# Patient Record
Sex: Female | Born: 1990 | Race: White | Hispanic: No | Marital: Single | Smoking: Former smoker
Health system: Southern US, Community
[De-identification: ages and names within clinical notes are randomized; demographics above are authoritative.]

## PROBLEM LIST (undated history)

## (undated) DIAGNOSIS — E282 Polycystic ovarian syndrome: Secondary | ICD-10-CM

## (undated) DIAGNOSIS — Z87442 Personal history of urinary calculi: Secondary | ICD-10-CM

## (undated) HISTORY — PX: OTHER SURGICAL HISTORY: SHX169

---
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
## 2008-04-06 ENCOUNTER — Emergency Department (HOSPITAL_COMMUNITY): Admission: EM | Admit: 2008-04-06 | Discharge: 2008-04-06 | Payer: Self-pay | Admitting: Emergency Medicine

## 2008-06-13 ENCOUNTER — Emergency Department (HOSPITAL_COMMUNITY): Admission: EM | Admit: 2008-06-13 | Discharge: 2008-06-13 | Payer: Self-pay | Admitting: Family Medicine

## 2008-09-06 ENCOUNTER — Emergency Department (HOSPITAL_COMMUNITY): Admission: EM | Admit: 2008-09-06 | Discharge: 2008-09-06 | Payer: Self-pay | Admitting: Family Medicine

## 2008-10-09 ENCOUNTER — Emergency Department (HOSPITAL_COMMUNITY): Admission: EM | Admit: 2008-10-09 | Discharge: 2008-10-09 | Payer: Self-pay | Admitting: Emergency Medicine

## 2008-10-22 ENCOUNTER — Emergency Department (HOSPITAL_COMMUNITY): Admission: EM | Admit: 2008-10-22 | Discharge: 2008-10-22 | Payer: Self-pay | Admitting: Emergency Medicine

## 2008-10-23 ENCOUNTER — Inpatient Hospital Stay (HOSPITAL_COMMUNITY): Admission: AD | Admit: 2008-10-23 | Discharge: 2008-10-23 | Payer: Self-pay | Admitting: Family Medicine

## 2009-05-30 ENCOUNTER — Emergency Department (HOSPITAL_COMMUNITY): Admission: EM | Admit: 2009-05-30 | Discharge: 2009-05-30 | Payer: Self-pay | Admitting: Family Medicine

## 2010-02-01 IMAGING — US US OB TRANSVAGINAL
1 series · 14 of 28 positions shown · non-contrast
Comparison: None.

CLINICAL DATA: 18-year-old female with bleeding, possible
miscarriage. 8 weeks and 1 day gestation by LMP. Quantitative beta
HCG [DATE].

OBSTETRIC <14 WK ULTRASOUND
TECHNIQUE: Transabdominal ultrasound was performed for evaluation
of the gestation as well as the maternal uterus and adnexal
regions.

[Series 1: us ob transvaginal · 0.28mm/px · 14 of 35 slices shown]
[im 2/35]
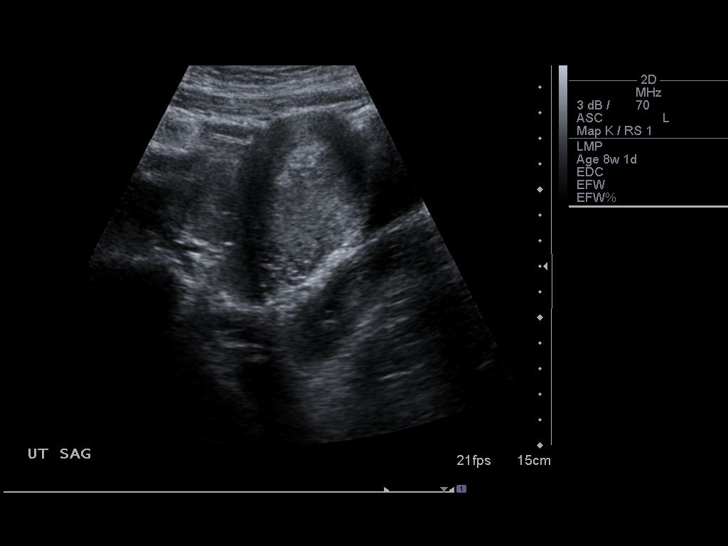
[im 4/35]
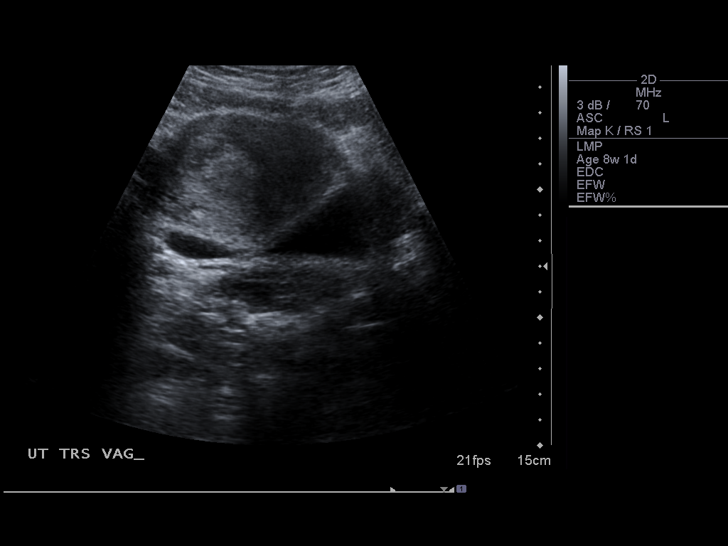
[im 7/35]
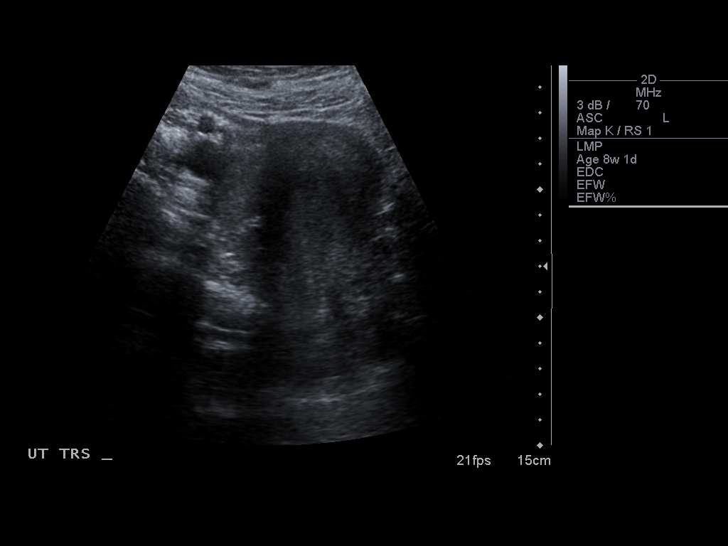
[im 9/35]
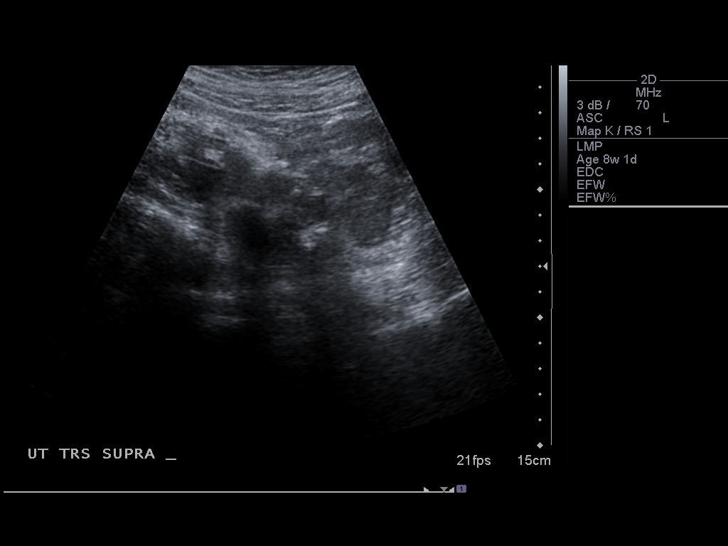
[im 12/35]
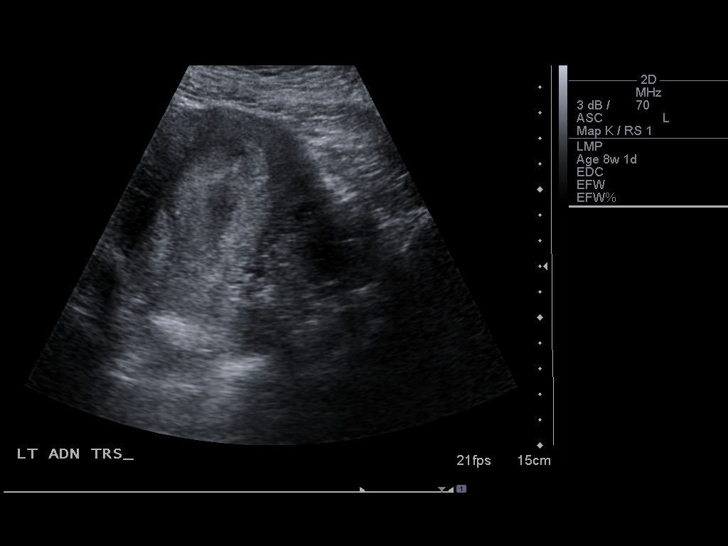
[im 14/35]
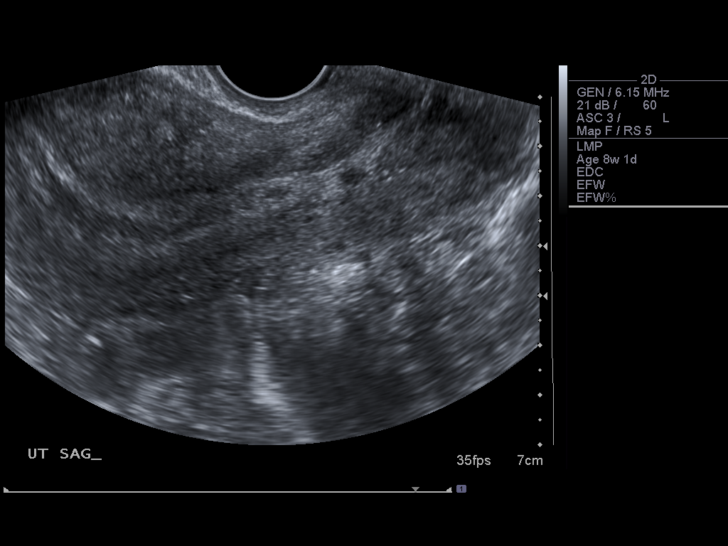
[im 17/35]
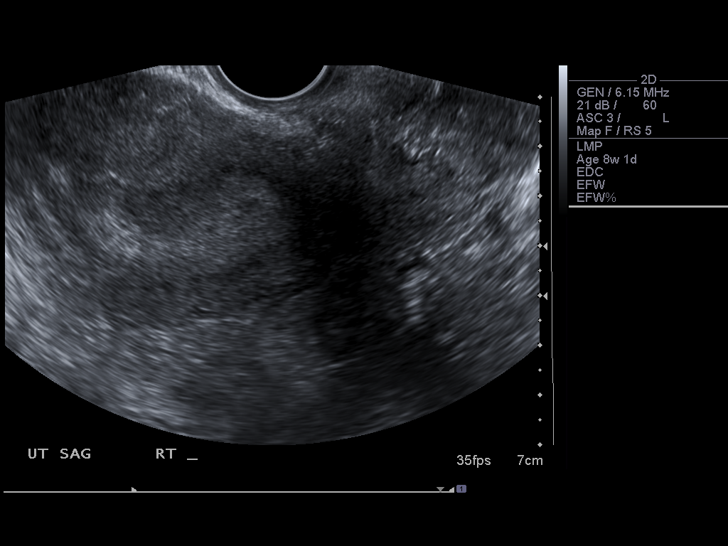
[im 19/35]
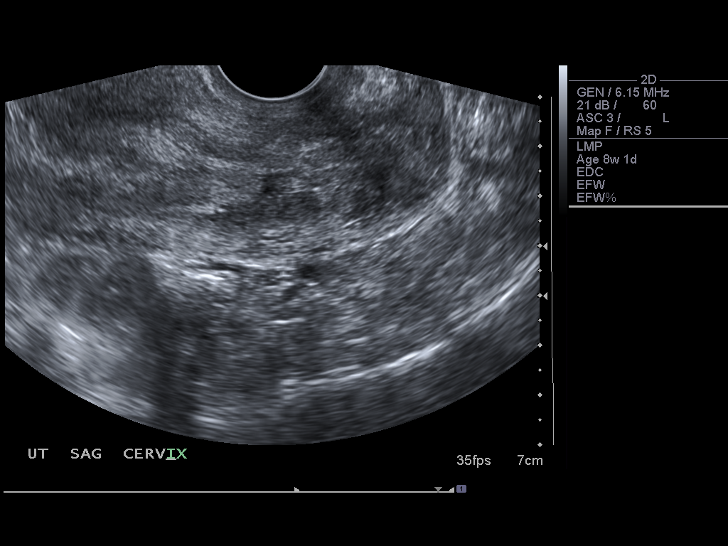
[im 22/35]
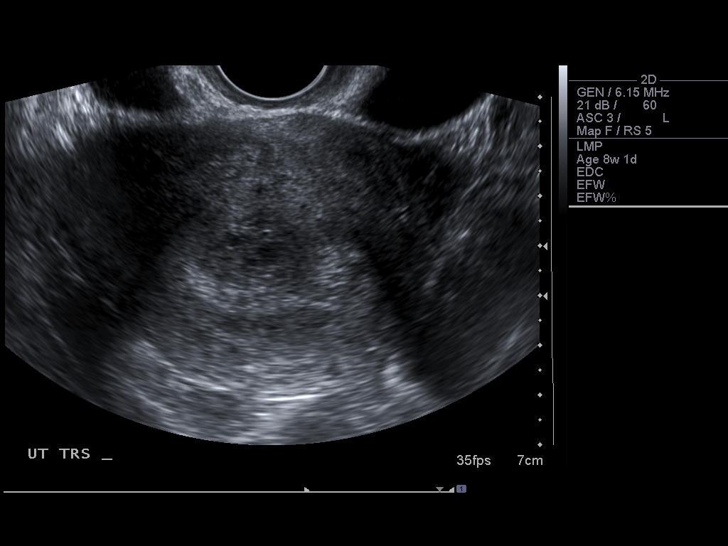
[im 24/35]
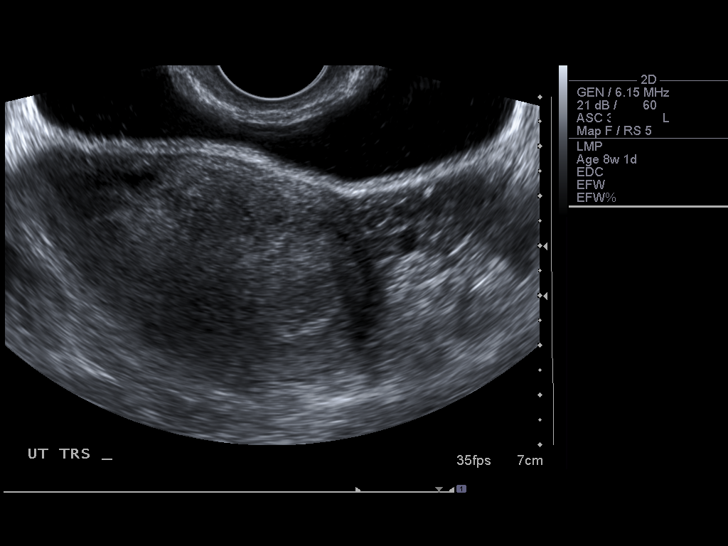
[im 27/35]
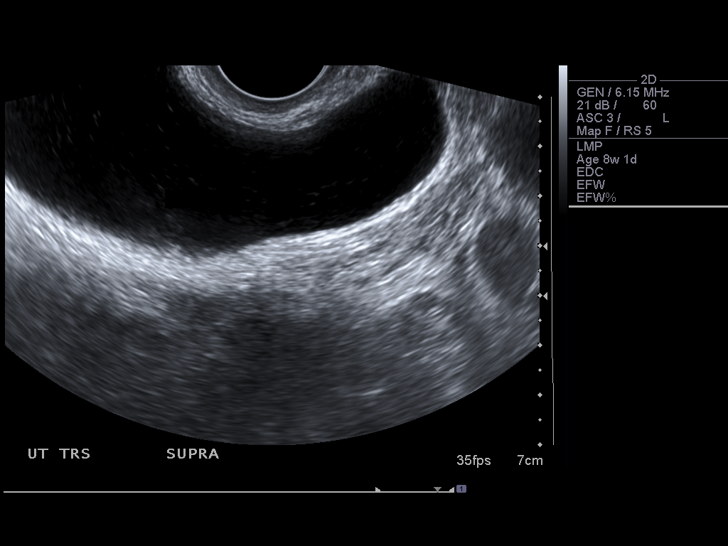
[im 29/35]
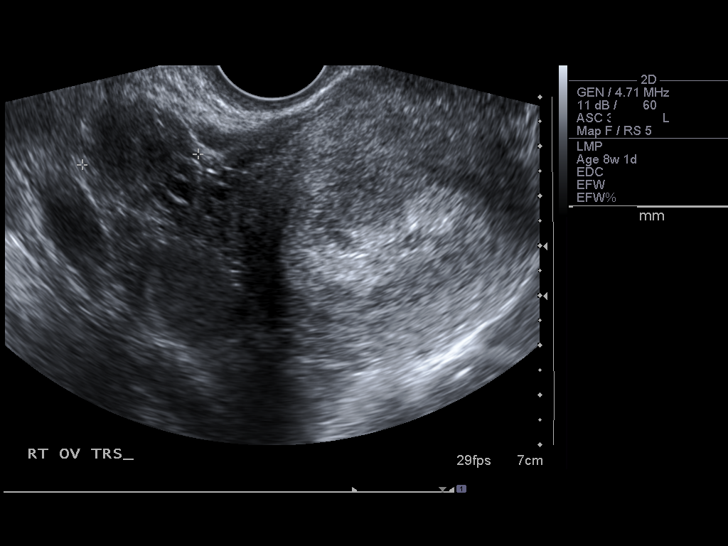
[im 32/35]
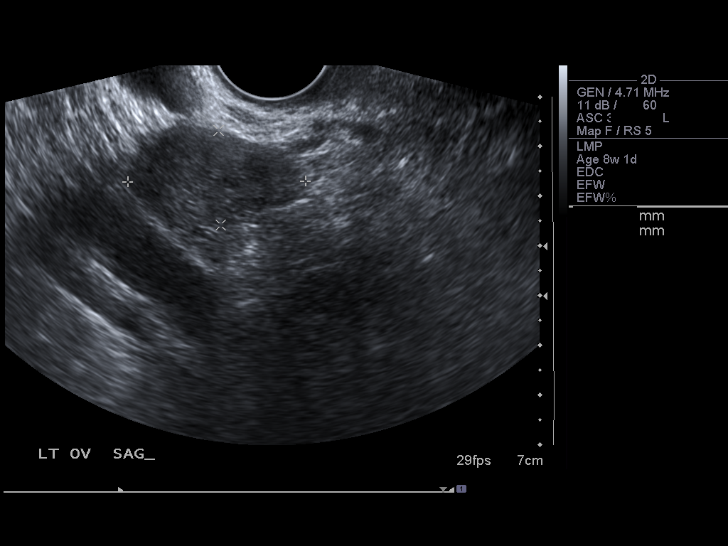
[im 35/35]
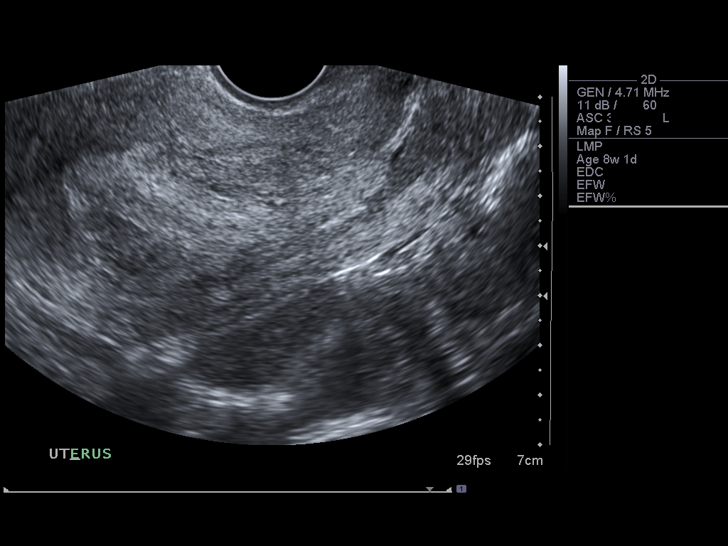

[14 of 28 positions shown; findings below may reference images not displayed]

Intrauterine gestational sac: None.
Yolk sac: None.
Embryo: None.
Cardiac Activity: None.

Maternal uterus/Adnexae:
No pelvic free fluid identified.  No subchorionic hemorrhage.
Suggestion of mixed echogenic debris in the endometrial cavity
(image 23).

Negative right ovary measuring 3.5 x 2.7 x 2.3 cm.  Negative left
ovary measuring 3.6 x 1.9 x 2.7 cm.
IMPRESSION: No intrauterine gestational sac.  No pelvic free fluid.  Suggestion
of mixed echogenic debris in the atrial cavity.  Differential
considerations include failed IUP, occult ectopic pregnancy, and
less likely early IUP.  Recommend correlation with serial
quantitative BHCG and followup imaging as indicated.

## 2010-03-15 ENCOUNTER — Emergency Department (HOSPITAL_COMMUNITY)
Admission: EM | Admit: 2010-03-15 | Discharge: 2010-03-15 | Payer: Self-pay | Source: Home / Self Care | Admitting: Family Medicine

## 2010-03-15 ENCOUNTER — Inpatient Hospital Stay (HOSPITAL_COMMUNITY)
Admission: AD | Admit: 2010-03-15 | Discharge: 2010-03-15 | Payer: Self-pay | Attending: Family Medicine | Admitting: Family Medicine

## 2010-03-15 LAB — POCT URINALYSIS DIPSTICK
Nitrite: NEGATIVE
Protein, ur: 100 mg/dL — AB
Specific Gravity, Urine: >=1.03 (ref 1.005–1.030)
Urine Glucose, Fasting: NEGATIVE mg/dL
Urobilinogen, UA: 1 mg/dL (ref 0.0–1.0)
pH: 5.5 (ref 5.0–8.0)

## 2010-03-15 LAB — POCT PREGNANCY, URINE: Preg Test, Ur: POSITIVE

## 2010-03-18 ENCOUNTER — Ambulatory Visit (HOSPITAL_COMMUNITY)
Admission: RE | Admit: 2010-03-18 | Discharge: 2010-03-18 | Payer: Self-pay | Source: Home / Self Care | Attending: Obstetrics and Gynecology | Admitting: Obstetrics and Gynecology

## 2010-03-25 LAB — HCG, QUANTITATIVE, PREGNANCY
hCG, Beta Chain, Quant, S: 51 m[IU]/mL — ABNORMAL HIGH (ref <5)
hCG, Beta Chain, Quant, S: 42 m[IU]/mL — ABNORMAL HIGH (ref <5)

## 2010-03-25 LAB — CBC
HCT: 37.5 % (ref 36.0–46.0)
Hemoglobin: 12.5 g/dL (ref 12.0–15.0)
MCH: 30.9 pg (ref 26.0–34.0)
MCHC: 33.3 g/dL (ref 30.0–36.0)
MCV: 92.8 fL (ref 78.0–100.0)
Platelets: 168 10*3/uL (ref 150–400)
RBC: 4.04 MIL/uL (ref 3.87–5.11)
RDW: 15.4 % (ref 11.5–15.5)
WBC: 5.3 10*3/uL (ref 4.0–10.5)

## 2010-03-25 LAB — RH IMMUNE GLOBULIN WORKUP (NOT WOMEN'S HOSP)
ABO/RH(D): A NEG
Antibody Screen: NEGATIVE
Unit division: 0

## 2010-03-25 LAB — GC/CHLAMYDIA PROBE AMP, GENITAL
Chlamydia, DNA Probe: NEGATIVE
GC Probe Amp, Genital: NEGATIVE

## 2010-03-25 LAB — WET PREP, GENITAL
Trich, Wet Prep: NONE SEEN
Yeast Wet Prep HPF POC: NONE SEEN

## 2010-06-02 LAB — POCT PREGNANCY, URINE: Preg Test, Ur: NEGATIVE

## 2010-06-15 LAB — CBC
HCT: 33.8 % — ABNORMAL LOW (ref 36.0–46.0)
Hemoglobin: 11.7 g/dL — ABNORMAL LOW (ref 12.0–15.0)
MCHC: 34.5 g/dL (ref 30.0–36.0)
MCV: 95.8 fL (ref 78.0–100.0)
Platelets: 143 10*3/uL — ABNORMAL LOW (ref 150–400)
RBC: 3.53 MIL/uL — ABNORMAL LOW (ref 3.87–5.11)
RDW: 14.6 % (ref 11.5–15.5)
WBC: 5.9 10*3/uL (ref 4.0–10.5)

## 2010-06-15 LAB — HCG, QUANTITATIVE, PREGNANCY: hCG, Beta Chain, Quant, S: 14698 m[IU]/mL — ABNORMAL HIGH (ref <5)

## 2010-06-15 LAB — DIFFERENTIAL
Basophils Absolute: 0 10*3/uL (ref 0.0–0.1)
Basophils Relative: 0 % (ref 0–1)
Eosinophils Absolute: 0 10*3/uL (ref 0.0–0.7)
Eosinophils Relative: 1 % (ref 0–5)
Lymphocytes Relative: 22 % (ref 12–46)
Lymphs Abs: 1.3 10*3/uL (ref 0.7–4.0)
Monocytes Absolute: 0.4 10*3/uL (ref 0.1–1.0)
Monocytes Relative: 6 % (ref 3–12)
Neutro Abs: 4.2 10*3/uL (ref 1.7–7.7)
Neutrophils Relative %: 71 % (ref 43–77)

## 2010-06-15 LAB — POCT URINALYSIS DIP (DEVICE)
Glucose, UA: NEGATIVE mg/dL
Hgb urine dipstick: NEGATIVE
Nitrite: NEGATIVE
Protein, ur: NEGATIVE mg/dL
Specific Gravity, Urine: 1.025 (ref 1.005–1.030)
Urobilinogen, UA: 1 mg/dL (ref 0.0–1.0)
pH: 6 (ref 5.0–8.0)

## 2010-06-15 LAB — URINE CULTURE: Colony Count: 1000

## 2010-06-15 LAB — RH IMMUNE GLOBULIN WORKUP (NOT WOMEN'S HOSP)
ABO/RH(D): A NEG
Antibody Screen: NEGATIVE

## 2010-06-15 LAB — POCT PREGNANCY, URINE: Preg Test, Ur: POSITIVE

## 2010-06-16 LAB — DIFFERENTIAL
Basophils Absolute: 0.1 10*3/uL (ref 0.0–0.1)
Basophils Relative: 1 % (ref 0–1)
Eosinophils Absolute: 0 10*3/uL (ref 0.0–0.7)
Eosinophils Relative: 1 % (ref 0–5)
Lymphocytes Relative: 24 % (ref 12–46)
Lymphs Abs: 2.3 10*3/uL (ref 0.7–4.0)
Monocytes Absolute: 0.7 10*3/uL (ref 0.1–1.0)
Monocytes Relative: 7 % (ref 3–12)
Neutro Abs: 6.6 10*3/uL (ref 1.7–7.7)
Neutrophils Relative %: 68 % (ref 43–77)

## 2010-06-16 LAB — POCT I-STAT, CHEM 8
BUN: 9 mg/dL (ref 6–23)
Calcium, Ion: 1.15 mmol/L (ref 1.12–1.32)
Chloride: 106 mEq/L (ref 96–112)
Creatinine, Ser: 0.7 mg/dL (ref 0.4–1.2)
Glucose, Bld: 86 mg/dL (ref 70–99)
HCT: 36 % (ref 36.0–46.0)
Hemoglobin: 12.2 g/dL (ref 12.0–15.0)
Potassium: 3.5 mEq/L (ref 3.5–5.1)
Sodium: 137 mEq/L (ref 135–145)
TCO2: 20 mmol/L (ref 0–100)

## 2010-06-16 LAB — CBC
HCT: 35.8 % — ABNORMAL LOW (ref 36.0–46.0)
Hemoglobin: 12.1 g/dL (ref 12.0–15.0)
MCHC: 33.9 g/dL (ref 30.0–36.0)
MCV: 95.4 fL (ref 78.0–100.0)
Platelets: 158 10*3/uL (ref 150–400)
RBC: 3.75 MIL/uL — ABNORMAL LOW (ref 3.87–5.11)
RDW: 14.5 % (ref 11.5–15.5)
WBC: 9.8 10*3/uL (ref 4.0–10.5)

## 2010-06-16 LAB — HCG, QUANTITATIVE, PREGNANCY: hCG, Beta Chain, Quant, S: 77162 m[IU]/mL — ABNORMAL HIGH (ref <5)

## 2010-06-16 LAB — ABO/RH: ABO/RH(D): A NEG

## 2010-06-17 LAB — WET PREP, GENITAL: Yeast Wet Prep HPF POC: NONE SEEN

## 2010-06-17 LAB — URINE CULTURE

## 2010-06-17 LAB — POCT URINALYSIS DIP (DEVICE)
Hgb urine dipstick: NEGATIVE
Nitrite: NEGATIVE
Protein, ur: NEGATIVE mg/dL
pH: 5 (ref 5.0–8.0)

## 2010-06-19 LAB — GC/CHLAMYDIA PROBE AMP, GENITAL
Chlamydia, DNA Probe: NEGATIVE
GC Probe Amp, Genital: NEGATIVE

## 2010-06-19 LAB — POCT URINALYSIS DIP (DEVICE)
Bilirubin Urine: NEGATIVE
Ketones, ur: NEGATIVE mg/dL
Nitrite: NEGATIVE
Protein, ur: NEGATIVE mg/dL
pH: 5.5 (ref 5.0–8.0)

## 2010-06-19 LAB — URINE CULTURE: Colony Count: >=100000

## 2010-06-24 LAB — URINALYSIS, ROUTINE W REFLEX MICROSCOPIC
Glucose, UA: NEGATIVE mg/dL
Hgb urine dipstick: NEGATIVE
Ketones, ur: >80 mg/dL — AB
Nitrite: NEGATIVE
Protein, ur: 30 mg/dL — AB
Specific Gravity, Urine: 1.032 — ABNORMAL HIGH (ref 1.005–1.030)
Urobilinogen, UA: 1 mg/dL (ref 0.0–1.0)
pH: 5.5 (ref 5.0–8.0)

## 2010-06-24 LAB — URINE CULTURE: Colony Count: 30000

## 2010-06-24 LAB — URINE MICROSCOPIC-ADD ON

## 2010-06-24 LAB — PREGNANCY, URINE: Preg Test, Ur: NEGATIVE

## 2010-12-05 LAB — POCT RAPID STREP A: Streptococcus, Group A Screen (Direct): NEGATIVE

## 2011-06-25 IMAGING — US US OB COMP LESS 14 WK
1 series · 14 of 28 positions shown · non-contrast
Comparison: None.

CLINICAL DATA: Vaginal bleeding at 5 weeks and 2 days of pregnancy
by last menstrual period.  No urine pregnancy test or quantitative
beta HCG available at this time.

OBSTETRIC <14 WK US AND TRANSVAGINAL OB US
TECHNIQUE: Both transabdominal and transvaginal ultrasound
examinations were performed for complete evaluation of the
gestation as well as the maternal uterus, adnexal regions, and
pelvic cul-de-sac.

[Series 1: us ob comp less 14 wks · 14 of 39 slices shown]
[im 2/39]
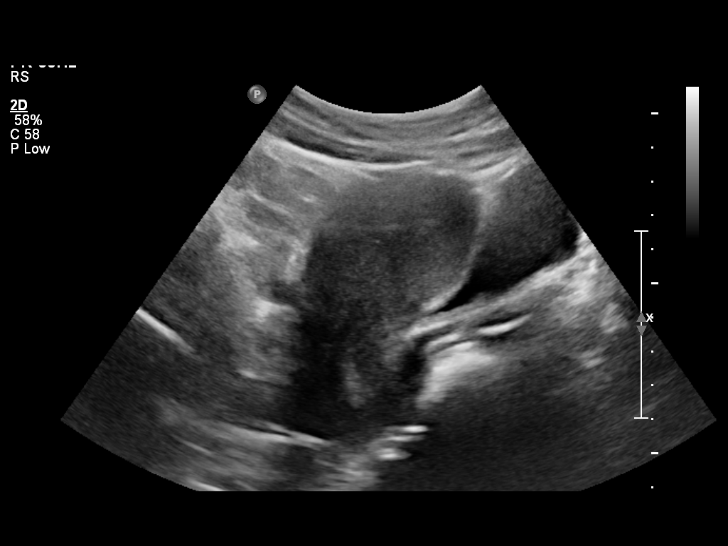
[im 5/39]
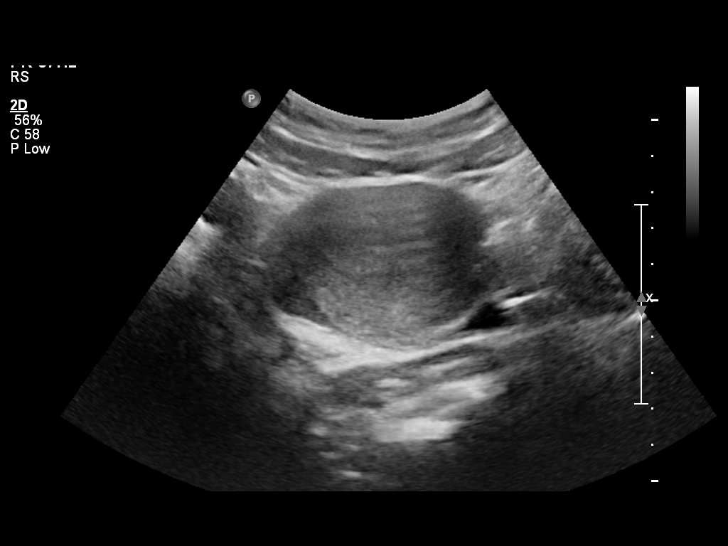
[im 8/39]
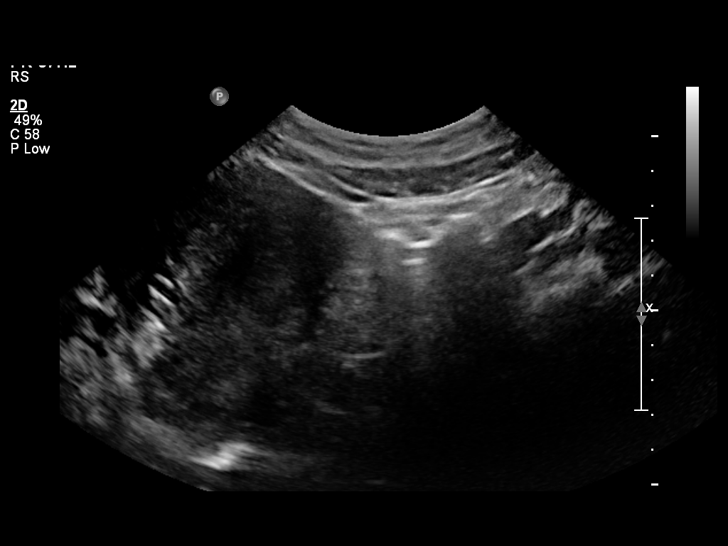
[im 10/39]
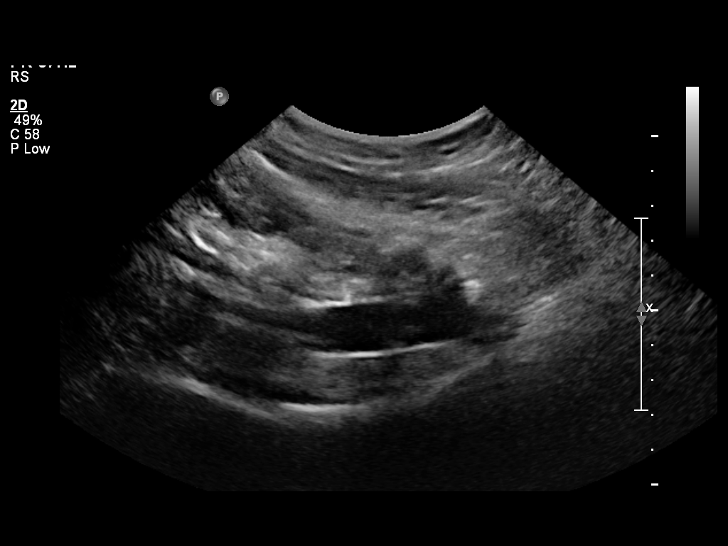
[im 13/39]
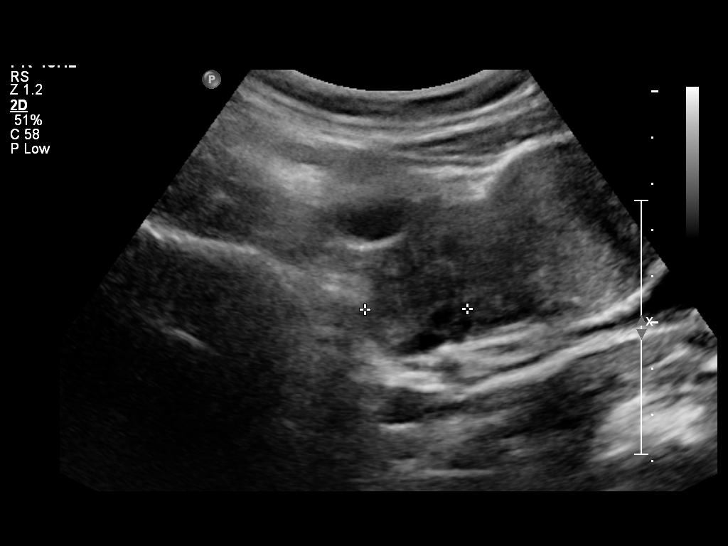
[im 16/39]
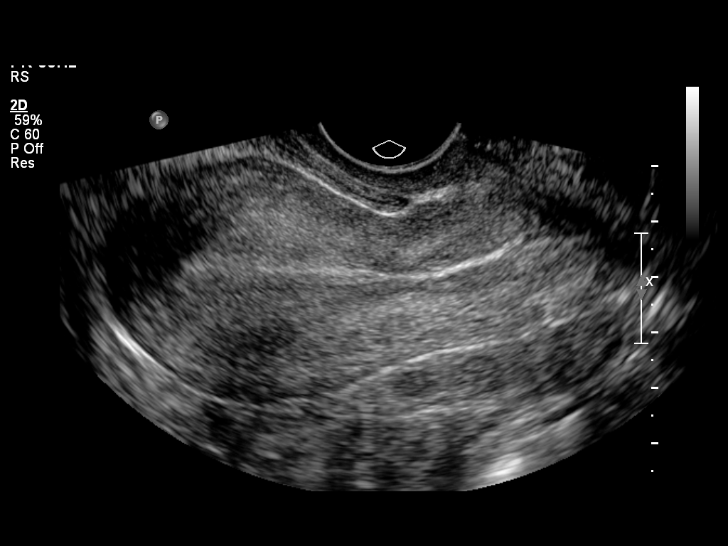
[im 19/39]
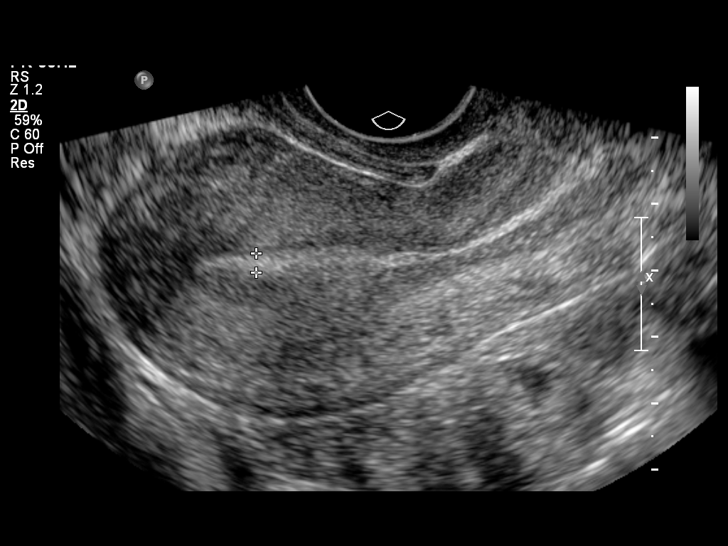
[im 22/39]
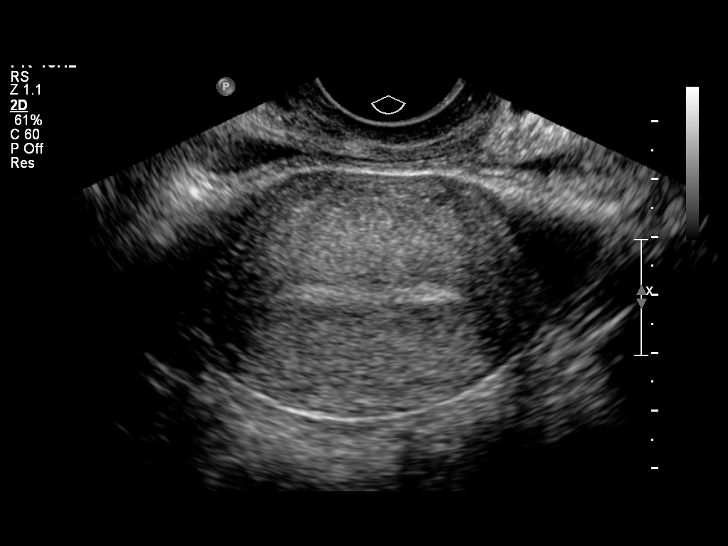
[im 24/39]
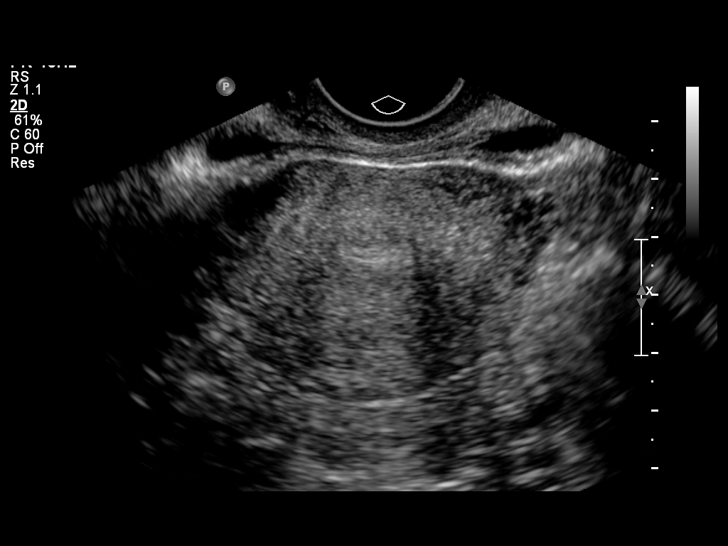
[im 27/39]
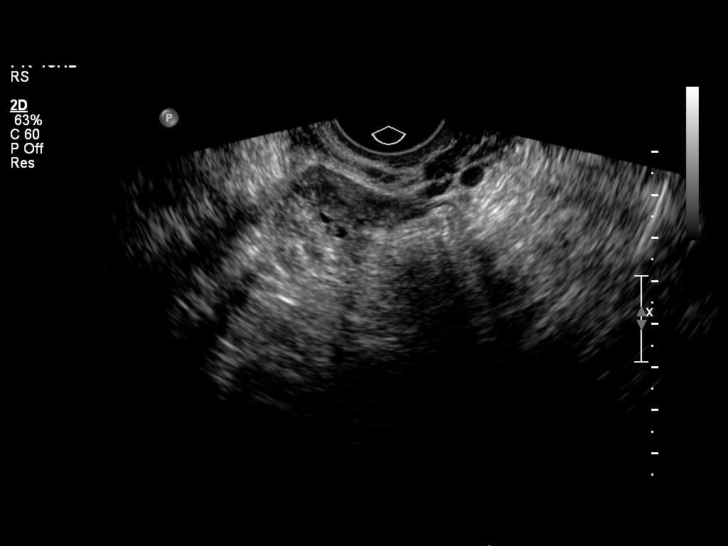
[im 30/39]
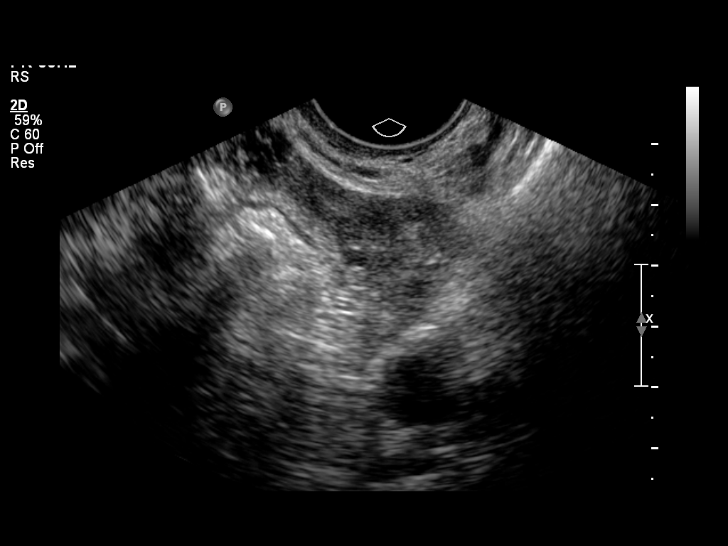
[im 33/39]
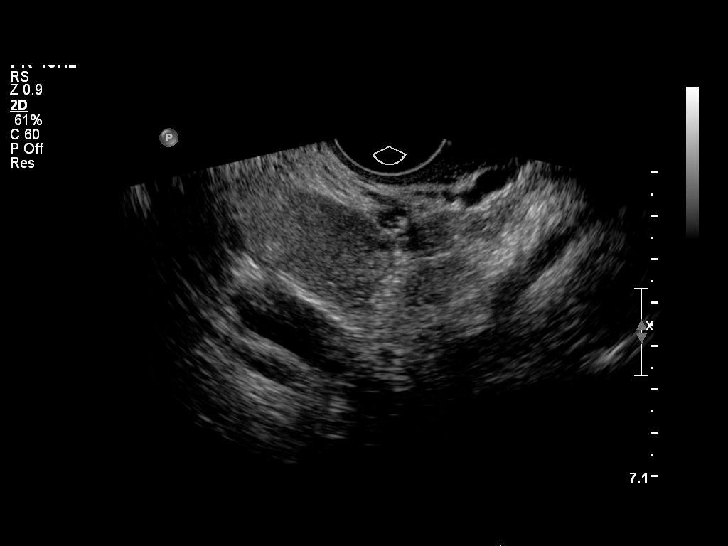
[im 36/39]
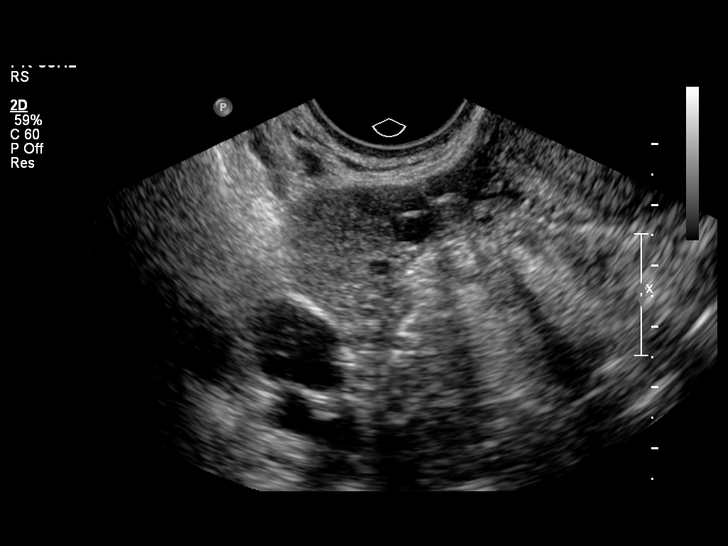
[im 39/39]
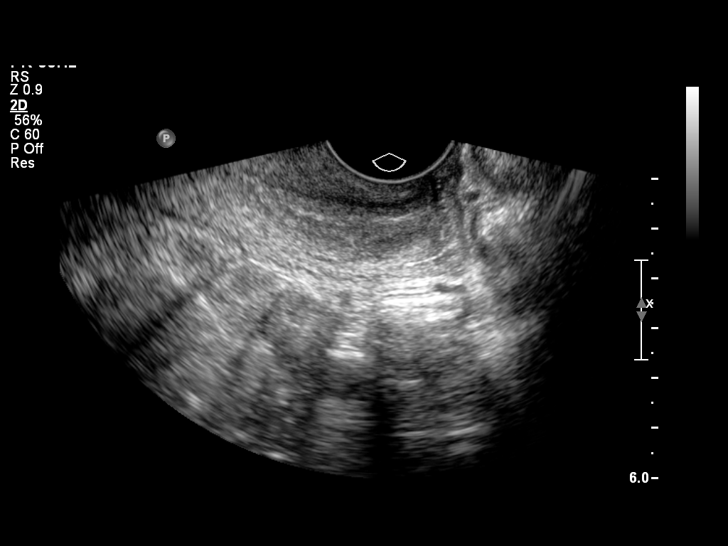

[14 of 28 positions shown; findings below may reference images not displayed]

FINDINGS: Normal appearing uterus with a normal appearing
endometrial stripe measuring 2.9 mm in maximum thickness
transvaginally.  Normal appearing ovaries.  The right ovary
measures 3.0 x 2.6 x 2.3 cm in maximum dimensions.  The left ovary
measures 2.9 x 2.4 x 2.0 cm in maximum dimensions.  No intrauterine
or extrauterine gestation seen.  No adnexal masses or free
peritoneal fluid.
IMPRESSION: Normal pelvic ultrasound with no intrauterine or extrauterine
gestation seen.  This does not exclude the possibility of a normal
early intrauterine pregnancy or nonvisualized ectopic pregnancy.
Correlation with a quantitative beta HCG is necessary.

## 2013-02-05 ENCOUNTER — Encounter (HOSPITAL_COMMUNITY): Payer: Self-pay | Admitting: Emergency Medicine

## 2013-02-05 ENCOUNTER — Emergency Department (INDEPENDENT_AMBULATORY_CARE_PROVIDER_SITE_OTHER)
Admission: EM | Admit: 2013-02-05 | Discharge: 2013-02-05 | Disposition: A | Discharge status: Home / Self Care | Payer: Self-pay | Source: Home / Self Care

## 2013-02-05 ENCOUNTER — Other Ambulatory Visit (HOSPITAL_COMMUNITY)
Admission: RE | Admit: 2013-02-05 | Discharge: 2013-02-05 | Disposition: A | Discharge status: Home / Self Care | Payer: Self-pay | Source: Ambulatory Visit | Attending: Emergency Medicine | Admitting: Emergency Medicine

## 2013-02-05 DIAGNOSIS — Z113 Encounter for screening for infections with a predominantly sexual mode of transmission: Secondary | ICD-10-CM | POA: Insufficient documentation

## 2013-02-05 DIAGNOSIS — N760 Acute vaginitis: Secondary | ICD-10-CM | POA: Insufficient documentation

## 2013-02-05 DIAGNOSIS — B9689 Other specified bacterial agents as the cause of diseases classified elsewhere: Secondary | ICD-10-CM

## 2013-02-05 DIAGNOSIS — A499 Bacterial infection, unspecified: Secondary | ICD-10-CM

## 2013-02-05 DIAGNOSIS — Z7251 High risk heterosexual behavior: Secondary | ICD-10-CM

## 2013-02-05 LAB — POCT URINALYSIS DIP (DEVICE)
Nitrite: NEGATIVE
Protein, ur: NEGATIVE mg/dL
Specific Gravity, Urine: >=1.03 (ref 1.005–1.030)
Urobilinogen, UA: 0.2 mg/dL (ref 0.0–1.0)

## 2013-02-05 LAB — POCT PREGNANCY, URINE: Preg Test, Ur: NEGATIVE

## 2013-02-05 MED ORDER — METRONIDAZOLE 500 MG PO TABS: 500.0000 mg | ORAL_TABLET | Freq: Two times a day (BID) | ORAL | Status: DC

## 2013-02-05 NOTE — ED Provider Notes (Signed)
CSN: [id number]     Arrival date & time 02/05/13  0935 History   None    Chief Complaint  Patient presents with  . Vaginal Discharge   (Consider location/radiation/quality/duration/timing/severity/associated sxs/prior Treatment)  HPI  Patient presents today with an onset of a grade to white vaginal discharge onset approximately 3 weeks ago. Patient states she is not attempting to get pregnant, but is engaging in high risk sexual behavior.  Admits the use of condoms only twice out of the past 5 times engaging in heterosexual intercourse.     History reviewed. No pertinent past medical history. History reviewed. No pertinent past surgical history. No family history on file. History  Substance Use Topics  . Smoking status: Current Every Day Smoker  . Smokeless tobacco: Not on file  . Alcohol Use: No   OB History   Grav Para Term Preterm Abortions TAB SAB Ect Mult Living                 Review of Systems  Constitutional: Negative.  Negative for fever, chills and fatigue.  HENT: Negative.   Eyes: Negative.   Respiratory: Negative.   Cardiovascular: Negative.   Gastrointestinal: Negative.  Negative for nausea, vomiting, abdominal pain and diarrhea.  Endocrine: Negative.   Genitourinary: Positive for vaginal discharge. Negative for dysuria, urgency, flank pain, vaginal pain and dyspareunia.  Musculoskeletal: Negative.   Skin: Negative.   Allergic/Immunologic: Negative.   Neurological: Negative.   Hematological: Negative.   Psychiatric/Behavioral: Negative.      She denies any vaginal itching or burning.  Denies dyspareunia.  Patient denies frequency burning or discomfort with urination. Not abdominal pain.   Denies any history of sexual transmitted infections. Last Pap approximately 1 year ago.  The patient has s a 50-month-old daughter, delivered vaginally with no difficulties.   Allergies  Review of patient's allergies indicates no known allergies.  Home Medications    Current Outpatient Rx  Name  Route  Sig  Dispense  Refill  . metroNIDAZOLE (FLAGYL) 500 MG tablet   Oral   Take 1 tablet (500 mg total) by mouth 2 (two) times daily.   14 tablet   0    BP 120/72  Pulse 72  Temp(Src) 98.6 F (37 C) (Oral)  Resp 18  SpO2 100%  LMP 01/26/2013 Physical Exam  Nursing note and vitals reviewed. Constitutional: She is oriented to person, place, and time. She appears well-developed and well-nourished. No distress.  Neck: Normal range of motion. Neck supple.  Cardiovascular: Normal rate, regular rhythm, normal heart sounds and intact distal pulses.  Exam reveals no gallop and no friction rub.   No murmur heard. Pulmonary/Chest: Effort normal and breath sounds normal. No respiratory distress. She has no wheezes. She has no rales. She exhibits no tenderness.  Abdominal: Soft. Bowel sounds are normal. She exhibits no distension and no mass. There is no tenderness. There is no rebound.  Genitourinary: Uterus normal. Vaginal discharge found.  Thin white mucoid discharge noted from cervical os.  Some friability noted on cervical examination. No appreciable odor.    No CMT.  Patient tolerated pelvic examination well.   Lymphadenopathy:    She has no cervical adenopathy.  Neurological: She is alert and oriented to person, place, and time.  Skin: Skin is warm and dry. No rash noted. She is not diaphoretic. No erythema. No pallor.    ED Course  Procedures (including critical care time)  Labs Review Labs Reviewed  POCT URINALYSIS DIP (DEVICE) -  Abnormal; Notable for the following:    Hgb urine dipstick TRACE (*)    Leukocytes, UA TRACE (*)    All other components within normal limits  POCT PREGNANCY, URINE  CERVICOVAGINAL ANCILLARY ONLY   Imaging Review No results found.   MDM   1. Bacterial vaginosis   2. High risk heterosexual behavior    Differentials:  GC/Chlamydia - Cultures pending  Meds ordered this encounter  Medications  .  metroNIDAZOLE (FLAGYL) 500 MG tablet    Sig: Take 1 tablet (500 mg total) by mouth 2 (two) times daily.    Dispense:  14 tablet    Refill:  0   The patient strongly advised to avoid EtOH for the next 10 days. He should verbalizes understanding of plan of care and pending labwork.     Weber Cooks, NP 02/05/13 1136

## 2013-02-05 NOTE — ED Notes (Signed)
Pt  Reports  Grayish  Vaginal   Discharge  X  3  Weeks  With a  Foul  Odor         -  She  Appears  In no  Acute  Distress  She  Ambulated with a  Steady  Fluid  Upright  Gait

## 2013-02-05 NOTE — ED Provider Notes (Signed)
Medical screening examination/treatment/procedure(s) were performed by a resident physician or non-physician practitioner and as the supervising physician I was immediately available for consultation/collaboration.  Evan Corey, MD    Evan S Corey, MD 02/05/13 1913 

## 2013-02-08 NOTE — ED Notes (Signed)
GC/Chlamydia neg., Affirm: Candida and Trich neg.  Gardnerella pos.  Pt. adequately treated with Flagyl. Vassie Moselle 02/08/2013

## 2014-03-23 ENCOUNTER — Emergency Department (HOSPITAL_COMMUNITY)
Admission: EM | Admit: 2014-03-23 | Discharge: 2014-03-23 | Disposition: A | Discharge status: Home / Self Care | Payer: Medicaid Other | Source: Home / Self Care | Attending: Family Medicine | Admitting: Family Medicine

## 2014-03-23 ENCOUNTER — Other Ambulatory Visit (HOSPITAL_COMMUNITY)
Admission: RE | Admit: 2014-03-23 | Discharge: 2014-03-23 | Disposition: A | Discharge status: Home / Self Care | Payer: Medicaid Other | Source: Ambulatory Visit | Attending: Family Medicine | Admitting: Family Medicine

## 2014-03-23 ENCOUNTER — Encounter (HOSPITAL_COMMUNITY): Payer: Self-pay | Admitting: Emergency Medicine

## 2014-03-23 DIAGNOSIS — N760 Acute vaginitis: Secondary | ICD-10-CM | POA: Diagnosis present

## 2014-03-23 DIAGNOSIS — R634 Abnormal weight loss: Secondary | ICD-10-CM

## 2014-03-23 DIAGNOSIS — Z113 Encounter for screening for infections with a predominantly sexual mode of transmission: Secondary | ICD-10-CM | POA: Diagnosis present

## 2014-03-23 DIAGNOSIS — N926 Irregular menstruation, unspecified: Secondary | ICD-10-CM

## 2014-03-23 LAB — POCT URINALYSIS DIP (DEVICE)
Glucose, UA: NEGATIVE mg/dL
Hgb urine dipstick: NEGATIVE
Ketones, ur: 15 mg/dL — AB
Nitrite: NEGATIVE
PROTEIN: 30 mg/dL — AB
SPECIFIC GRAVITY, URINE: 1.02 (ref 1.005–1.030)
Urobilinogen, UA: 0.2 mg/dL (ref 0.0–1.0)
pH: 7.5 (ref 5.0–8.0)

## 2014-03-23 LAB — POCT PREGNANCY, URINE: Preg Test, Ur: NEGATIVE

## 2014-03-23 LAB — TSH: TSH: 1.519 u[IU]/mL (ref 0.350–4.500)

## 2014-03-23 NOTE — ED Notes (Signed)
Pt triaged and assessed by provider.   Provider in before nurse. 

## 2014-03-23 NOTE — Discharge Instructions (Signed)
Thank you for coming in today. I will call with results if positive.  Come back as needed.  Follow up with [facility] about your periods and your birth control.  Follow up with your primary doctor.    Abnormal Uterine Bleeding Abnormal uterine bleeding can affect women at various stages in life, including teenagers, women in their reproductive years, pregnant women, and women who have reached menopause. Several kinds of uterine bleeding are considered abnormal, including:  Bleeding or spotting between periods.   Bleeding after sexual intercourse.   Bleeding that is heavier or more than normal.   Periods that last longer than usual.  Bleeding after menopause.  Many cases of abnormal uterine bleeding are minor and simple to treat, while others are more serious. Any type of abnormal bleeding should be evaluated by your health care provider. Treatment will depend on the cause of the bleeding. HOME CARE INSTRUCTIONS Monitor your condition for any changes. The following actions may help to alleviate any discomfort you are experiencing:  Avoid the use of tampons and douches as directed by your health care provider.  Change your pads frequently. You should get regular pelvic exams and Pap tests. Keep all follow-up appointments for diagnostic tests as directed by your health care provider.  SEEK MEDICAL CARE IF:   Your bleeding lasts more than 1 week.   You feel dizzy at times.  SEEK IMMEDIATE MEDICAL CARE IF:   You pass out.   You are changing pads every 15 to 30 minutes.   You have abdominal pain.  You have a fever.   You become sweaty or weak.   You are passing large blood clots from the vagina.   You start to feel nauseous and vomit. MAKE SURE YOU:   Understand these instructions.  Will watch your condition.  Will get help right away if you are not doing well or get worse. Document Released: 02/24/2005 Document Revised: 03/01/2013 Document Reviewed:  09/23/2012 [facility] Patient Information 2015 [place]. This information is not intended to replace advice given to you by your health care provider. Make sure you discuss any questions you have with your health care provider.

## 2014-03-23 NOTE — ED Provider Notes (Signed)
Veronica Deleon is a 24 y.o. female who presents to Urgent Care today for multiple issues. 1) patient feels a mass in the anterior aspect of her right neck. That's been present for a month continues to be worse in the evening. It's minimally sore. No trouble swallowing or speaking. No fevers or chills.  Patient notes some weight loss over the past year or so. She notes about 30 pounds but denies any night sweats fevers or chills. She says her weight loss is due to stress. 2) patient additionally notes irregular vaginal bleeding. This is been ongoing for many months.   3) patient will additionally STD testing. She is completely asymptomatic but states that it's about the right time of year for her to get a test.   No past medical history on file. No past surgical history on file. History  Substance Use Topics  . Smoking status: Current Every Day Smoker  . Smokeless tobacco: Not on file  . Alcohol Use: No   ROS as above Medications: No current facility-administered medications for this encounter.   No current outpatient prescriptions on file.   No Known Allergies   Exam:  BP 135/89 mmHg  Pulse 90  Temp(Src) 98.7 F (37.1 C) (Oral)  Resp 14  SpO2 100% Gen: Well NAD HEENT: EOMI,  MMM No visible neck mass. No palpable mass. Normal thyroid. Normal appearing.  posterior pharynx.  Lungs: Normal work of breathing. CTABL Heart: RRR no MRG Abd: NABS, Soft. Nondistended, Nontender No CV angle TTP Exts: Brisk capillary refill, warm and well perfused.  Lymph: No lymphadenopathy palpable   Results for orders placed or performed during the hospital encounter of 03/23/14 (from the past 24 hour(s))  POCT urinalysis dip (device)     Status: Abnormal   Collection Time: 03/23/14  3:44 PM  Result Value Ref Range   Glucose, UA NEGATIVE NEGATIVE mg/dL   Bilirubin Urine SMALL (A) NEGATIVE   Ketones, ur 15 (A) NEGATIVE mg/dL   Specific Gravity, Urine 1.020 1.005 - 1.030   Hgb urine dipstick  NEGATIVE NEGATIVE   pH 7.5 5.0 - 8.0   Protein, ur 30 (A) NEGATIVE mg/dL   Urobilinogen, UA 0.2 0.0 - 1.0 mg/dL   Nitrite NEGATIVE NEGATIVE   Leukocytes, UA SMALL (A) NEGATIVE  Pregnancy, urine POC     Status: None   Collection Time: 03/23/14  3:50 PM  Result Value Ref Range   Preg Test, Ur NEGATIVE NEGATIVE   No results found.  Assessment and Plan: 24 y.o. female with  1) neck mass. I believe patient is palpating her own hyoid cartilage. Plan to check a TSH and follow-up with PCP. 2) weight loss. Unclear possibly due to stress. Follow-up with PCP. 3) irregular menstrual bleeding. [facility]. 4) STD testing performed. Urine cytology and serology pending  Discussed warning signs or symptoms. Please see discharge instructions. Patient expresses understanding.     Rodolph BongEvan S Tijuan Dantes, MD 03/23/14 [phone]

## 2014-03-24 LAB — RPR: RPR: NONREACTIVE

## 2014-03-24 LAB — HIV ANTIBODY (ROUTINE TESTING W REFLEX): HIV-1/HIV-2 Ab: NONREACTIVE

## 2014-03-24 LAB — URINE CYTOLOGY ANCILLARY ONLY
Chlamydia: NEGATIVE
NEISSERIA GONORRHEA: NEGATIVE
Trichomonas: NEGATIVE

## 2014-03-25 LAB — URINE CULTURE: Special Requests: NORMAL

## 2014-03-30 ENCOUNTER — Telehealth (HOSPITAL_COMMUNITY): Payer: Self-pay | Admitting: *Deleted

## 2014-03-30 NOTE — ED Notes (Signed)
Pt. called on VM for her lab results. I called her back.  Pt. verified x 2 and given neg. Results. Vassie MoselleYork, Breyonna Nault M 03/30/2014

## 2015-06-04 ENCOUNTER — Encounter (HOSPITAL_COMMUNITY): Payer: Self-pay | Admitting: *Deleted

## 2015-06-04 ENCOUNTER — Emergency Department (HOSPITAL_COMMUNITY)
Admission: EM | Admit: 2015-06-04 | Discharge: 2015-06-04 | Disposition: A | Discharge status: Home / Self Care | Payer: Medicaid Other | Attending: Emergency Medicine | Admitting: Emergency Medicine

## 2015-06-04 DIAGNOSIS — F17200 Nicotine dependence, unspecified, uncomplicated: Secondary | ICD-10-CM | POA: Insufficient documentation

## 2015-06-04 DIAGNOSIS — Z793 Long term (current) use of hormonal contraceptives: Secondary | ICD-10-CM | POA: Diagnosis not present

## 2015-06-04 DIAGNOSIS — K589 Irritable bowel syndrome without diarrhea: Secondary | ICD-10-CM

## 2015-06-04 DIAGNOSIS — K580 Irritable bowel syndrome with diarrhea: Secondary | ICD-10-CM | POA: Diagnosis not present

## 2015-06-04 DIAGNOSIS — Z3202 Encounter for pregnancy test, result negative: Secondary | ICD-10-CM | POA: Insufficient documentation

## 2015-06-04 DIAGNOSIS — K625 Hemorrhage of anus and rectum: Secondary | ICD-10-CM | POA: Diagnosis present

## 2015-06-04 LAB — COMPREHENSIVE METABOLIC PANEL
ALT: 17 U/L (ref 14–54)
ANION GAP: 10 (ref 5–15)
AST: 17 U/L (ref 15–41)
Albumin: 3.9 g/dL (ref 3.5–5.0)
Alkaline Phosphatase: 50 U/L (ref 38–126)
BUN: 9 mg/dL (ref 6–20)
CHLORIDE: 107 mmol/L (ref 101–111)
CO2: 17 mmol/L — AB (ref 22–32)
Calcium: 9.2 mg/dL (ref 8.9–10.3)
Creatinine, Ser: 0.72 mg/dL (ref 0.44–1.00)
GFR calc non Af Amer: >60 mL/min (ref >60)
Glucose, Bld: 114 mg/dL — ABNORMAL HIGH (ref 65–99)
Potassium: 4 mmol/L (ref 3.5–5.1)
SODIUM: 134 mmol/L — AB (ref 135–145)
Total Bilirubin: 0.5 mg/dL (ref 0.3–1.2)
Total Protein: 6.3 g/dL — ABNORMAL LOW (ref 6.5–8.1)

## 2015-06-04 LAB — CBC
HCT: 42.4 % (ref 36.0–46.0)
HEMOGLOBIN: 14.3 g/dL (ref 12.0–15.0)
MCH: 31.8 pg (ref 26.0–34.0)
MCHC: 33.7 g/dL (ref 30.0–36.0)
MCV: 94.2 fL (ref 78.0–100.0)
Platelets: 228 10*3/uL (ref 150–400)
RBC: 4.5 MIL/uL (ref 3.87–5.11)
RDW: 12.9 % (ref 11.5–15.5)
WBC: 7.5 10*3/uL (ref 4.0–10.5)

## 2015-06-04 LAB — URINALYSIS, ROUTINE W REFLEX MICROSCOPIC
BILIRUBIN URINE: NEGATIVE
Glucose, UA: NEGATIVE mg/dL
HGB URINE DIPSTICK: NEGATIVE
Ketones, ur: 15 mg/dL — AB
Leukocytes, UA: NEGATIVE
Nitrite: NEGATIVE
Protein, ur: NEGATIVE mg/dL
Specific Gravity, Urine: 1.014 (ref 1.005–1.030)
pH: 7 (ref 5.0–8.0)

## 2015-06-04 LAB — I-STAT BETA HCG BLOOD, ED (MC, WL, AP ONLY)

## 2015-06-04 LAB — LIPASE, BLOOD: LIPASE: 26 U/L (ref 11–51)

## 2015-06-04 MED ORDER — ONDANSETRON 4 MG PO TBDP
4.0000 mg | ORAL_TABLET | Freq: Once | ORAL | Status: AC
  Administered 2015-06-04: 4 mg via ORAL
  Filled 2015-06-04: qty 1

## 2015-06-04 NOTE — ED Notes (Signed)
Pt reports onset of abd cramping on Saturday. Having n/v and also reports "feeling constipated" but is having loose stools. Now also has rectal irritation and bleeding. No acute distress noted at triage.

## 2015-06-04 NOTE — ED Provider Notes (Signed)
CSN: [id number]     Arrival date & time 06/04/15  4098 History   First MD Initiated Contact with Patient 06/04/15 0912     Chief Complaint  Patient presents with  . Emesis  . Diarrhea  . Rectal Bleeding     (Consider location/radiation/quality/duration/timing/severity/associated sxs/prior Treatment) Patient is a 25 y.o. female presenting with diarrhea and hematochezia. The history is provided by the patient.  Diarrhea Quality:  Semi-solid Severity:  Moderate Onset quality:  Gradual Number of episodes:  4x last night in small amounts Duration:  2 days Timing:  Constant Progression:  Unchanged Relieved by:  Nothing Worsened by:  Nothing tried Ineffective treatments:  None tried Associated symptoms: abdominal pain (lower cramping) and vomiting (last night)   Associated symptoms: no fever   Risk factors: no recent antibiotic use, no sick contacts, no suspicious food intake and no travel to endemic areas   Rectal Bleeding Associated symptoms: abdominal pain (lower cramping) and vomiting (last night)   Associated symptoms: no fever     History reviewed. No pertinent past medical history. History reviewed. No pertinent past surgical history. History reviewed. No pertinent family history. Social History  Substance Use Topics  . Smoking status: Current Every Day Smoker  . Smokeless tobacco: None  . Alcohol Use: No   OB History    No data available     Review of Systems  Constitutional: Negative for fever.  Gastrointestinal: Positive for vomiting (last night), abdominal pain (lower cramping), diarrhea and hematochezia.  All other systems reviewed and are negative.     Allergies  Review of patient's allergies indicates no known allergies.  Home Medications   Prior to Admission medications   Medication Sig Start Date End Date Taking? Authorizing Provider  levonorgestrel-ethinyl estradiol (SEASONALE,INTROVALE,JOLESSA) 0.15-0.03 MG tablet Take 1 tablet by mouth daily.  04/04/15 04/03/16 Yes Historical Provider, MD  Multiple Vitamins-Minerals (THERA-M) TABS Take 1 tablet by mouth daily.   Yes Historical Provider, MD  Probiotic Product (ACIDOPHILUS/GOAT MILK) CAPS Take 1 tablet by mouth daily.   Yes Historical Provider, MD   BP 119/77 mmHg  Pulse 60  Temp(Src) 97.8 F (36.6 C) (Oral)  Resp 16  SpO2 99% Physical Exam  Constitutional: She is oriented to person, place, and time. She appears well-developed and well-nourished. No distress.  HENT:  Head: Normocephalic.  Eyes: Conjunctivae are normal.  Neck: Neck supple. No tracheal deviation present.  Cardiovascular: Normal rate, regular rhythm and normal heart sounds.   Pulmonary/Chest: Effort normal and breath sounds normal. No respiratory distress.  Abdominal: Soft. She exhibits no distension. There is no tenderness. There is no rebound and no guarding.  Neurological: She is alert and oriented to person, place, and time.  Skin: Skin is warm and dry.  Psychiatric: She has a normal mood and affect.  Vitals reviewed.   ED Course  Procedures (including critical care time) Labs Review Labs Reviewed  COMPREHENSIVE METABOLIC PANEL - Abnormal; Notable for the following:    Sodium 134 (*)    CO2 17 (*)    Glucose, Bld 114 (*)    Total Protein 6.3 (*)    All other components within normal limits  LIPASE, BLOOD  CBC  URINALYSIS, ROUTINE W REFLEX MICROSCOPIC (NOT AT [facility])  I-STAT BETA HCG BLOOD, ED ([facility], [facility], [facility] ONLY)    Imaging Review No results found. I have personally reviewed and evaluated these images and lab results as part of my medical decision-making.   EKG Interpretation None  MDM   Final diagnoses:  Irritable bowel    25 y.o. female presents with Loose stools and lower abdominal cramping which have since subsided. She has a feeling of tenesmus. No vaginal bleeding or discharge. No significant abdominal tenderness on exam. Reassuring vital signs. Laboratory values are lacking  evidence of acute intra-abdominal emergency. Discussed possibility of irritable bowels given cycles of loose stools and constipation with rectal irritation. Had scant blood on toilet paper with stable hemoglobin. Recommended bismuth salts and high fiber diet to help with symptoms. Plan to follow up with PCP as needed and return precautions discussed for worsening or new concerning symptoms.     Lyndal Pulleyaniel Navya Timmons, MD 06/04/15 1040

## 2015-06-04 NOTE — Discharge Instructions (Signed)
High-Fiber Diet  Fiber, also called dietary fiber, is a type of carbohydrate found in fruits, vegetables, whole grains, and beans. A high-fiber diet can have many health benefits. Your health care provider may recommend a high-fiber diet to help:  · Prevent constipation. Fiber can make your bowel movements more regular.  · Lower your cholesterol.  · Relieve hemorrhoids, uncomplicated diverticulosis, or irritable bowel syndrome.  · Prevent overeating as part of a weight-loss plan.  · Prevent heart disease, type 2 diabetes, and certain cancers.  WHAT IS MY PLAN?  The recommended daily intake of fiber includes:  · 38 grams for men under age 50.  · 30 grams for men over age 50.  · 25 grams for women under age 50.  · 21 grams for women over age 50.  You can get the recommended daily intake of dietary fiber by eating a variety of fruits, vegetables, grains, and beans. Your health care provider may also recommend a fiber supplement if it is not possible to get enough fiber through your diet.  WHAT DO I NEED TO KNOW ABOUT A HIGH-FIBER DIET?  · Fiber supplements have not been widely studied for their effectiveness, so it is better to get fiber through food sources.  · Always check the fiber content on the nutrition facts label of any prepackaged food. Look for foods that contain at least 5 grams of fiber per serving.  · Ask your dietitian if you have questions about specific foods that are related to your condition, especially if those foods are not listed in the following section.  · Increase your daily fiber consumption gradually. Increasing your intake of dietary fiber too quickly may cause bloating, cramping, or gas.  · Drink plenty of water. Water helps you to digest fiber.  WHAT FOODS CAN I EAT?  Grains  Whole-grain breads. Multigrain cereal. Oats and oatmeal. Brown rice. Barley. Bulgur wheat. Millet. Bran muffins. Popcorn. Rye wafer crackers.  Vegetables  Sweet potatoes. Spinach. Kale. Artichokes. Cabbage. Broccoli.  Green peas. Carrots. Squash.  Fruits  Berries. Pears. Apples. Oranges. Avocados. Prunes and raisins. Dried figs.  Meats and Other Protein Sources  Navy, kidney, pinto, and soy beans. Split peas. Lentils. Nuts and seeds.  Dairy  Fiber-fortified yogurt.  Beverages  Fiber-fortified soy milk. Fiber-fortified orange juice.  Other  Fiber bars.  The items listed above may not be a complete list of recommended foods or beverages. Contact your dietitian for more options.  WHAT FOODS ARE NOT RECOMMENDED?  Grains  White bread. Pasta made with refined flour. White rice.  Vegetables  Fried potatoes. Canned vegetables. Well-cooked vegetables.   Fruits  Fruit juice. Cooked, strained fruit.  Meats and Other Protein Sources  Fatty cuts of meat. Fried poultry or fried fish.  Dairy  Milk. Yogurt. Cream cheese. Sour cream.  Beverages  Soft drinks.  Other  Cakes and pastries. Butter and oils.  The items listed above may not be a complete list of foods and beverages to avoid. Contact your dietitian for more information.  WHAT ARE SOME TIPS FOR INCLUDING HIGH-FIBER FOODS IN MY DIET?  · Eat a wide variety of high-fiber foods.  · Make sure that half of all grains consumed each day are whole grains.  · Replace breads and cereals made from refined flour or white flour with whole-grain breads and cereals.  · Replace white rice with brown rice, bulgur wheat, or millet.  · Start the day with a breakfast that is high in fiber, such as a   cereal that contains at least 5 grams of fiber per serving.  · Use beans in place of meat in soups, salads, or pasta.  · Eat high-fiber snacks, such as berries, raw vegetables, nuts, or popcorn.     This information is not intended to replace advice given to you by your health care provider. Make sure you discuss any questions you have with your health care provider.     Document Released: 02/24/2005 Document Revised: 03/17/2014 Document Reviewed: 08/09/2013  [employer]vier Interactive Patient Education ©2016 [employer]

## 2018-12-05 ENCOUNTER — Other Ambulatory Visit: Payer: Self-pay

## 2018-12-05 ENCOUNTER — Emergency Department (HOSPITAL_COMMUNITY)
Admission: EM | Admit: 2018-12-05 | Discharge: 2018-12-06 | Discharge status: Against Medical Advice | Payer: Self-pay | Attending: Emergency Medicine | Admitting: Emergency Medicine

## 2018-12-05 ENCOUNTER — Encounter (HOSPITAL_COMMUNITY): Payer: Self-pay

## 2018-12-05 DIAGNOSIS — Z5321 Procedure and treatment not carried out due to patient leaving prior to being seen by health care provider: Secondary | ICD-10-CM | POA: Insufficient documentation

## 2018-12-05 LAB — URINALYSIS, ROUTINE W REFLEX MICROSCOPIC
Bacteria, UA: NONE SEEN
Bilirubin Urine: NEGATIVE
Glucose, UA: NEGATIVE mg/dL
Hgb urine dipstick: NEGATIVE
Ketones, ur: 80 mg/dL — AB
Nitrite: NEGATIVE
Protein, ur: 100 mg/dL — AB
Specific Gravity, Urine: 1.03 (ref 1.005–1.030)
pH: 6 (ref 5.0–8.0)

## 2018-12-05 LAB — COMPREHENSIVE METABOLIC PANEL
ALT: 12 U/L (ref 0–44)
AST: 13 U/L — ABNORMAL LOW (ref 15–41)
Albumin: 4.4 g/dL (ref 3.5–5.0)
Alkaline Phosphatase: 55 U/L (ref 38–126)
Anion gap: 14 (ref 5–15)
BUN: 9 mg/dL (ref 6–20)
CO2: 18 mmol/L — ABNORMAL LOW (ref 22–32)
Calcium: 9.2 mg/dL (ref 8.9–10.3)
Chloride: 103 mmol/L (ref 98–111)
Creatinine, Ser: 0.95 mg/dL (ref 0.44–1.00)
GFR calc Af Amer: >60 mL/min (ref >60)
GFR calc non Af Amer: >60 mL/min (ref >60)
Glucose, Bld: 92 mg/dL (ref 70–99)
Potassium: 3.6 mmol/L (ref 3.5–5.1)
Sodium: 135 mmol/L (ref 135–145)
Total Bilirubin: 1 mg/dL (ref 0.3–1.2)
Total Protein: 6.8 g/dL (ref 6.5–8.1)

## 2018-12-05 LAB — LIPASE, BLOOD: Lipase: 28 U/L (ref 11–51)

## 2018-12-05 LAB — CBC
HCT: 43.2 % (ref 36.0–46.0)
Hemoglobin: 14.4 g/dL (ref 12.0–15.0)
MCH: 32.6 pg (ref 26.0–34.0)
MCHC: 33.3 g/dL (ref 30.0–36.0)
MCV: 97.7 fL (ref 80.0–100.0)
Platelets: 220 10*3/uL (ref 150–400)
RBC: 4.42 MIL/uL (ref 3.87–5.11)
RDW: 13 % (ref 11.5–15.5)
WBC: 7.6 10*3/uL (ref 4.0–10.5)
nRBC: 0 % (ref 0.0–0.2)

## 2018-12-05 LAB — I-STAT BETA HCG BLOOD, ED (MC, WL, AP ONLY): I-stat hCG, quantitative: <5 m[IU]/mL (ref <5)

## 2018-12-05 MED ORDER — SODIUM CHLORIDE 0.9% FLUSH: 3.0000 mL | Freq: Once | INTRAVENOUS | Status: DC

## 2018-12-05 NOTE — ED Triage Notes (Signed)
Pt presents with waking up in the middle of the night vomiting x2 weeks, seen PCP prescribed Pepcid which helped for 6 days but now she is vomiting blood. Pt has a bucket with her, she has streaks of blood in her vomit

## 2018-12-06 ENCOUNTER — Emergency Department (HOSPITAL_COMMUNITY)
Admission: EM | Admit: 2018-12-06 | Discharge: 2018-12-07 | Discharge status: Against Medical Advice | Payer: Self-pay | Attending: Emergency Medicine | Admitting: Emergency Medicine

## 2018-12-06 ENCOUNTER — Other Ambulatory Visit: Payer: Self-pay

## 2018-12-06 ENCOUNTER — Encounter (HOSPITAL_COMMUNITY): Payer: Self-pay

## 2018-12-06 DIAGNOSIS — Z5321 Procedure and treatment not carried out due to patient leaving prior to being seen by health care provider: Secondary | ICD-10-CM | POA: Insufficient documentation

## 2018-12-06 HISTORY — DX: Polycystic ovarian syndrome: E28.2

## 2018-12-06 NOTE — ED Triage Notes (Addendum)
Patient reports that she has nausea during the day and vomiting at night, Patient states she was vomiting blood yesterday, but none today. Patient states "My legs are locked up."  Patient states she came to the ED yesterday/last night,but left due to the wait.  Patient states she had blood drawn last night and really did not want to have it done at this time.

## 2018-12-06 NOTE — ED Notes (Addendum)
Pt did not answer x 2 

## 2019-03-28 ENCOUNTER — Ambulatory Visit (HOSPITAL_COMMUNITY)
Admission: EM | Admit: 2019-03-28 | Discharge: 2019-03-28 | Disposition: A | Discharge status: Home / Self Care | Payer: 59 | Attending: Family Medicine | Admitting: Family Medicine

## 2019-03-28 ENCOUNTER — Encounter (HOSPITAL_COMMUNITY): Payer: Self-pay | Admitting: Family Medicine

## 2019-03-28 ENCOUNTER — Other Ambulatory Visit: Payer: Self-pay

## 2019-03-28 DIAGNOSIS — R519 Headache, unspecified: Secondary | ICD-10-CM

## 2019-03-28 MED ORDER — AMOXICILLIN-POT CLAVULANATE 875-125 MG PO TABS: 1.0000 | ORAL_TABLET | Freq: Two times a day (BID) | ORAL | 0 refills | Status: DC

## 2019-03-28 MED ORDER — PREDNISONE 10 MG (21) PO TBPK: ORAL_TABLET | ORAL | 0 refills | Status: DC

## 2019-03-28 NOTE — Discharge Instructions (Addendum)
We will treat you for a sinus infection to see if this helps. Otherwise if your symptoms continue you will need to follow-up with your primary care for further evaluation If your headache worsens and you start developing other concerning symptoms you need to go to the ER.

## 2019-03-29 ENCOUNTER — Encounter (HOSPITAL_COMMUNITY): Payer: Self-pay | Admitting: Family Medicine

## 2019-03-29 NOTE — ED Provider Notes (Addendum)
[facility]    CSN: [id number] Arrival date & time: 03/28/19  1349      History   Chief Complaint No chief complaint on file.   HPI Veronica Deleon is a 29 y.o. female.   Patient is a 29 year old female presents today with approximately 1 month or more of intermittent, waxing waning headaches.  The headaches are located to the frontal area, behind eyes and temples.  She has trouble breathing through the right side of her nose at times.  Feels pressure in the nasal area.  Some rhinorrhea from the left nare.  She has been using antihistamines, nasal sprays, Sudafed, ibuprofen and Excedrin Migraine without much relief.  Feels like when she lays down the pain improves.  Denies any dizziness, blurred vision, lightheadedness, trouble with speech, muscle weakness, gait abnormality, numbness, tingling, extremity weakness, memory loss.  ROS per HPI      Past Medical History:  Diagnosis Date  . PCOS (polycystic ovarian syndrome)     There are no problems to display for this patient.   Past Surgical History:  Procedure Laterality Date  . tubes in ears      OB History   No obstetric history on file.      Home Medications    Prior to Admission medications   Medication Sig Start Date End Date Taking? Authorizing Provider  amoxicillin-clavulanate (AUGMENTIN) 875-125 MG tablet Take 1 tablet by mouth every 12 (twelve) hours. 03/28/19   Dahlia Byes A, NP  levonorgestrel-ethinyl estradiol (SEASONALE,INTROVALE,JOLESSA) 0.15-0.03 MG tablet Take 1 tablet by mouth daily. 04/04/15 04/03/16  [provider]  Multiple Vitamins-Minerals (THERA-M) TABS Take 1 tablet by mouth daily.    [provider]  predniSONE (STERAPRED UNI-PAK 21 TAB) 10 MG (21) TBPK tablet 6 tabs for 1 day, then 5 tabs for 1 das, then 4 tabs for 1 day, then 3 tabs for 1 day, 2 tabs for 1 day, then 1 tab for 1 day 03/28/19   Dahlia Byes A, NP  Probiotic Product (ACIDOPHILUS/GOAT MILK) CAPS Take  1 tablet by mouth daily.    [provider]    Family History Family History  Problem Relation Age of Onset  . Healthy Mother   . Healthy Father     Social History Social History   Tobacco Use  . Smoking status: Former Games developer  . Smokeless tobacco: Never Used  Substance Use Topics  . Alcohol use: No  . Drug use: No     Allergies   Patient has no known allergies.   Review of Systems Review of Systems   Physical Exam Triage Vital Signs ED Triage Vitals  Enc Vitals Group     BP 03/28/19 1515 120/87     Pulse Rate 03/28/19 1515 93     Resp 03/28/19 1515 17     Temp 03/28/19 1515 98.4 F (36.9 C)     Temp Source 03/28/19 1515 Oral     SpO2 03/28/19 1515 99 %     Weight --      Height --      Head Circumference --      Peak Flow --      Pain Score 03/28/19 1516 7     Pain Loc --      Pain Edu? --      Excl. in GC? --    No data found.  Updated Vital Signs BP 120/87 (BP Location: Right Arm)   Pulse 93   Temp 98.4 F (  36.9 C) (Oral)   Resp 17   SpO2 99%   Visual Acuity Right Eye Distance:   Left Eye Distance:   Bilateral Distance:    Right Eye Near:   Left Eye Near:    Bilateral Near:     Physical Exam Vitals and nursing note reviewed.  Constitutional:      General: She is not in acute distress.    Appearance: She is well-developed.  HENT:     Head: Normocephalic and atraumatic.     Right Ear: Tympanic membrane and ear canal normal.     Left Ear: Tympanic membrane and ear canal normal.     Nose:     Comments: Right nasal turbinate swelling.     Mouth/Throat:     Pharynx: Oropharynx is clear.  Eyes:     Extraocular Movements: Extraocular movements intact.     Conjunctiva/sclera: Conjunctivae normal.     Pupils: Pupils are equal, round, and reactive to light.  Cardiovascular:     Rate and Rhythm: Normal rate and regular rhythm.     Heart sounds: No murmur.  Pulmonary:     Effort: Pulmonary effort is normal. No respiratory  distress.     Breath sounds: Normal breath sounds.  Abdominal:     Palpations: Abdomen is soft.     Tenderness: There is no abdominal tenderness.  Musculoskeletal:        General: Normal range of motion.     Cervical back: Normal range of motion and neck supple.  Skin:    General: Skin is warm and dry.  Neurological:     General: No focal deficit present.     Mental Status: She is alert.  Psychiatric:        Mood and Affect: Mood normal.      UC Treatments / Results  Labs (all labs ordered are listed, but only abnormal results are displayed) Labs Reviewed - No data to display  EKG   Radiology No results found.  Procedures Procedures (including critical care time)  Medications Ordered in UC Medications - No data to display  Initial Impression / Assessment and Plan / UC Course  I have reviewed the triage vital signs and the nursing notes.  Pertinent labs & imaging results that were available during my care of the patient were reviewed by me and considered in my medical decision making (see chart for details).     Headache- appears to be sinus related.  We will try to treat for sinus infection to see if this improves symptoms.  Treating with prednisone and Augmentin.  No focal neuro deficits today.  No concern for any intracranial abnormalities at this time.  Recommended if symptoms continue she need to follow-up with ear nose throat specialist and possibly even neurology. Otherwise if symptoms worsen to include worsening headache or other neurological concerning symptoms you need to go to the ER.  Patient understanding and agree.   Final Clinical Impressions(s) / UC Diagnoses   Final diagnoses:  Acute intractable headache, unspecified headache type     Discharge Instructions     We will treat you for a sinus infection to see if this helps. Otherwise if your symptoms continue you will need to follow-up with your primary care for further evaluation If your  headache worsens and you start developing other concerning symptoms you need to go to the ER.    ED Prescriptions    Medication Sig Dispense Auth. Provider   predniSONE (STERAPRED UNI-PAK 21 TAB)  10 MG (21) TBPK tablet 6 tabs for 1 day, then 5 tabs for 1 das, then 4 tabs for 1 day, then 3 tabs for 1 day, 2 tabs for 1 day, then 1 tab for 1 day 21 tablet Eliberto Sole A, NP   amoxicillin-clavulanate (AUGMENTIN) 875-125 MG tablet Take 1 tablet by mouth every 12 (twelve) hours. 14 tablet Khloey Chern A, NP     PDMP not reviewed this encounter.      Dahlia Byes A, NP 03/29/19 1404

## 2019-04-05 ENCOUNTER — Other Ambulatory Visit: Payer: Self-pay

## 2019-04-05 ENCOUNTER — Ambulatory Visit (INDEPENDENT_AMBULATORY_CARE_PROVIDER_SITE_OTHER): Payer: 59 | Admitting: Licensed Clinical Social Worker

## 2019-04-05 DIAGNOSIS — F1211 Cannabis abuse, in remission: Secondary | ICD-10-CM

## 2019-04-05 DIAGNOSIS — F332 Major depressive disorder, recurrent severe without psychotic features: Secondary | ICD-10-CM | POA: Diagnosis not present

## 2019-04-05 DIAGNOSIS — F4310 Post-traumatic stress disorder, unspecified: Secondary | ICD-10-CM
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
## 2019-04-19 ENCOUNTER — Ambulatory Visit (INDEPENDENT_AMBULATORY_CARE_PROVIDER_SITE_OTHER): Payer: 59 | Admitting: Licensed Clinical Social Worker

## 2019-04-19 ENCOUNTER — Other Ambulatory Visit: Payer: Self-pay

## 2019-04-19 DIAGNOSIS — F332 Major depressive disorder, recurrent severe without psychotic features: Secondary | ICD-10-CM

## 2019-04-19 DIAGNOSIS — F4310 Post-traumatic stress disorder, unspecified: Secondary | ICD-10-CM

## 2019-04-19 DIAGNOSIS — F1211 Cannabis abuse, in remission: Secondary | ICD-10-CM

## 2019-04-19 NOTE — Progress Notes (Signed)
Virtual Visit via Telephone Note  I connected withAshley Sanduskyon 2/9/21at 1:00pmby telephone and verified that I am speaking with the correct person using two identifiers.  I discussed the limitations, risks, security and privacy concerns of performing an evaluation and management service by telephone and the availability of in person appointments. I also discussed with the patient that there may be a patient responsible charge related to this service. The patient expressed understanding and agreed to proceed.  I discussed the assessment and treatment plan with the patient. The patient was provided an opportunity to ask questions and all were answered. The patient agreed with the plan and demonstrated an understanding of the instructions.  The patient was advised to call back or seek an in-person evaluation if the symptoms worsen or if the condition fails to improve as anticipated.  I provided45 minutes of non-face-to-face time during this encounter.  Veronica Stain, LCSW, LCAS _____________________________ THERAPIST PROGRESS NOTE  Session Time: 1:00pm - 1:45pm   Participation Level: Active  Behavioral Response: Alert, anxious mood   Type of Therapy:  Individual Therapy  Treatment Goals addressed: Medication compliance; Abstinence; Self-care routine; Anger management; Relaxation techniques; Employment; Academic performance; Exercise; Sleep hygiene   Interventions: CBT  Summary: Veronica Deleon emailed clinician prior to session and requested telephone session instead due to Internet connectivity issues at new household.  She answered phone call for session and spoke in a manner that was alert, oriented x5, with no evidence or self-report of SI/HI or A/V H.  Veronica Deleon reported that she is compliant with medication at this time, and has abstained from alcohol and illicit substances.  Veronica Deleon reported scores of 1/10 for depression and 3-4/10 for anxiety at this time due to a medical appointment  she has today.  Veronica Deleon reported progress in avoiding any panic attacks, trying to watch interesting TV shows each day to de-stress following recent move, avoiding conflict while on the road by engaging in positive self-talk and allowing triggering people to pass her, maintaining employment, maintaining honors level performance at school, and gradually getting acquainted to her new living space following move.  Veronica Deleon reported that she has been unable to exercise regularly due to recent housing transition and she is still only getting around 6 hours sleep nightly due to racing thoughts.  She reported that she would like to explore new coping skills today in session, and was open to suggestions offered by clinician from list. Veronica Deleon reported that she plans to begin playing more video and board games with her child, look up yoga routines, consider going back to batting cages for softball, going to parks with family, doing her nails, and leaving affirmations around the house to motivate herself on tough days.  Veronica Deleon participated in mindfulness exercise and reported that she was very tense at first, but was gradually able to relax more and noticed that her mind was full of distracting thoughts, which tends to interfere with her sleep at night.  She reported that she would practice this regularly and report back in 2 weeks.     Suicidal/Homicidal: None, without plan or intent.   Therapist Response: Clinician spoke with Veronica Deleon via telephone for therapy today per her request.  Clinician assessed for safety and sobriety.  Clinician inquired about present emotional ratings, as well as any significant changes in thoughts, feelings, or behavior since last conversation.  Clinician revisited treatment goals with Veronica Deleon to determine where progress is being made, as well as present barriers.  Clinician went through list of coping skills/self-care  activities that Veronica Deleon could include in her self-care routine, including examples  such as doing puzzles, playing boardgames, playing video games, finding artistic outlets for emotions, visiting [employer] and getting new books to read, organizing living space to be more orderly and relaxing, visiting parks, dancing, establishing yoga routine, engaging in sports, writing down affirmations to place around the home, and many more.  Clinician inquired about which activities Veronica Deleon has already tried, as well as new ones that interest her and could be explored during free time.  Clinician also offered to demonstrate mindful breathing technique for relaxation today in session.  Clinician invited Veronica Deleon to get comfortable, find a relaxing breathing pattern, and then focus upon her breath for several minutes, allowing thoughts and feelings to be acknowledged, but let go of so that she could stay grounded in present.  Clinician inquired about effectiveness afterward and encouraged her to practice this for 5-10 minutes 2-3 times per day.  Clinician will continue to monitor.    Plan: Follow up again in 2 weeks virtually.   Diagnosis: Major depressive disorder, recurrent, severe, with anxious distress; PTSD; and Cannabis Use Disorder, mild, in early remission.  Shade Flood, LCSW, LCAS 04/19/19

## 2019-05-03 ENCOUNTER — Other Ambulatory Visit: Payer: Self-pay

## 2019-05-03 ENCOUNTER — Ambulatory Visit (INDEPENDENT_AMBULATORY_CARE_PROVIDER_SITE_OTHER): Payer: 59 | Admitting: Licensed Clinical Social Worker

## 2019-05-03 DIAGNOSIS — F1211 Cannabis abuse, in remission: Secondary | ICD-10-CM

## 2019-05-03 DIAGNOSIS — F4310 Post-traumatic stress disorder, unspecified: Secondary | ICD-10-CM

## 2019-05-03 DIAGNOSIS — F332 Major depressive disorder, recurrent severe without psychotic features: Secondary | ICD-10-CM | POA: Diagnosis not present

## 2019-05-03 NOTE — Progress Notes (Signed)
Virtual Visit via Video Note  I connected withAshley [id number] Webex application and verified that I am speaking with the correct person using two identifiers.  I discussed the limitations, risks, security and privacy concerns of performing an evaluation and management service by telephone and the availability of in person appointments. I also discussed with the patient that there may be a patient responsible charge related to this service. The patient expressed understanding and agreed to proceed.  I discussed the assessment and treatment plan with the patient. The patient was provided an opportunity to ask questions and all were answered. The patient agreed with the plan and demonstrated an understanding of the instructions.  The patient was advised to call back or seek an in-person evaluation if the symptoms worsen or if the condition fails to improve as anticipated.  I provided45 minutes of non-face-to-face time during this encounter.  Shade Flood, LCSW, LCAS _____________________________ THERAPIST PROGRESS NOTE  Session Time: 1:00pm - 1:45pm  Participation Level: Active  Behavioral Response: Alert, casually dressed, euthymic mood/affect  Type of Therapy:  Individual Therapy  Treatment Goals addressed: Medication compliance; Abstinence; Self-care routine; Anger management; Relaxation techniques; Employment; Academic performance; Exercise; Sleep hygiene   Interventions: CBT, grounding techniques   Summary: Ranay presented on time for [employer] appointment today.  She was alert, oriented x5, with no evidence or self-report of SI/HI or A/V H.  Flynn reported ongoing compliance with medication at this time, and has remained abstinent from alcohol and illicit substances.  Rilynne reported scores of 1/10 for depression and 2/10 for anxiety at this time, stating "I'm doing really good today".  Laurrie reported that she believes she is less  stressed because her school hours have dropped to 8 hours weekly, and this has also led to more time for self-care with family, as they recently bought a new video game to play together during downtime.  Jolyssa noted additional progress in using breathing technique to stay calm, and reported that this helped her cope with recent medical news to avoid a panic attack.  She reported that she has also been able to talk herself down during moments of agitation, and "Brush off the little stuff" without getting angry.  Maryjayne reported that she is sleeping well and gets 8 hours on average now, but she has not been able to work out regularly yet.  Miryam also reported trying to take vitamins and focus on her diet more to improve health.  Xianna was agreeable to discussing grounding techniques today and participated in 5-4-3-2-1 exercise successfully by identifying various sensory elements of her environment.  She reported that she would try this along with other suggested techniques when feeling increasing panic to attempt de-escalation.  Akela reported that she would report back in 2 weeks.      Suicidal/Homicidal: None; without plan or intent.    Therapist Response: Clinician spoke with Caryl Pina via Webex today.  Clinician assessed for safety and sobriety.  Clinician inquired about present emotional ratings, as well as any significant changes in thoughts, feelings, or behavior since last conversation.  Clinician revisited treatment goals with Kamarii to determine where progress is being made, as well as present barriers.  Clinician praised Jazminn for her progress and inquired about whether she would like to explore topic of grounding skills, given recent panic attack.  Clinician explained how grounding techniques can be helpful for temporarily distracting oneself when beginning to experience difficult thoughts or feelings, and provided initial example of 5-4-3-2-1 grounding, which involves stating 5  things one can see, 4  things one can touch, 3 things one can hear, 2 things one can smell, and 1 thing that can be tasted. Clinician also discussed difference between physical (running hot or cold water on hands, using a grounding object), mental (listing categories of things or counting), and self-soothing (saying positive affirmations or visualizing pleasant images) grounding categories.  Clinician encouraged her to practice these regularly to assist in mood regulation and will continue to monitor.     Plan: Follow up again in 2 weeks virtually.   Diagnosis: Major depressive disorder, recurrent, severe, with anxious distress; PTSD; and Cannabis Use Disorder, mild, in early remission.  Noralee Stain, LCSW, LCAS 05/03/19

## 2019-05-23 ENCOUNTER — Ambulatory Visit (HOSPITAL_COMMUNITY): Payer: 59 | Admitting: Licensed Clinical Social Worker

## 2019-05-24 ENCOUNTER — Other Ambulatory Visit: Payer: Self-pay

## 2019-05-24 ENCOUNTER — Ambulatory Visit (INDEPENDENT_AMBULATORY_CARE_PROVIDER_SITE_OTHER): Payer: 59 | Admitting: Licensed Clinical Social Worker

## 2019-05-24 DIAGNOSIS — F332 Major depressive disorder, recurrent severe without psychotic features: Secondary | ICD-10-CM

## 2019-05-24 DIAGNOSIS — F4310 Post-traumatic stress disorder, unspecified: Secondary | ICD-10-CM

## 2019-05-24 DIAGNOSIS — F1211 Cannabis abuse, in remission: Secondary | ICD-10-CM | POA: Diagnosis not present

## 2019-05-24 NOTE — Progress Notes (Signed)
Virtual Visit via Video Note   I connected with Veronica Deleon on 05/24/19 at 2:00pm by [facility] application and verified that I am speaking with the correct person using two identifiers.   I discussed the limitations, risks, security and privacy concerns of performing an evaluation and management service by telephone and the availability of in person appointments. I also discussed with the patient that there may be a patient responsible charge related to this service. The patient expressed understanding and agreed to proceed.   I discussed the assessment and treatment plan with the patient. The patient was provided an opportunity to ask questions and all were answered. The patient agreed with the plan and demonstrated an understanding of the instructions.   The patient was advised to call back or seek an in-person evaluation if the symptoms worsen or if the condition fails to improve as anticipated.   I provided 45 minutes of non-face-to-face time during this encounter.   Noralee Stain, LCSW, LCAS _____________________________ THERAPIST PROGRESS NOTE   Session Time: 2:00pm - 2:45pm   Participation Level: Active   Behavioral Response: Alert, casually dressed, euthymic mood/affect   Type of Therapy:  Individual Therapy   Treatment Goals addressed: Medication compliance; Abstinence; Self-care routine; Anger management; Relaxation techniques; Employment; Academic performance; Exercise; Sleep hygiene    Interventions: CBT, anger management techniques, guided imagery    Summary: Veronica Deleon is a 29 year old Caucasian female that presented for Webex virtual appointment today with Major depressive disorder, recurrent, severe, with anxious distress; PTSD; and Cannabis Use Disorder, mild, in early remission.   Suicidal/Homicidal: None; without plan or intent.    Therapist Response: Clinician spoke with Veronica Deleon via Webex today.  Clinician assessed for safety and sobriety.  Veronica Deleon presented  as alert, oriented x5, with no evidence or self-report of SI/HI or A/V H.  Veronica Deleon reported that she continues to stay abstinent from alcohol and illicit substances.  Clinician inquired about Veronica Deleon's emotional ratings today, as well as any significant changes in thoughts, feelings, or behavior since previous session.  Hawraa reported scores of 1/10 for both depression and anxiety today and denied any recent panic attacks.  Clinician revisited treatment goals with Dorita to determine where progress is being made, as well as present barriers.  Veronica Deleon reported that she has been trying to "Spend more time on myself" by engaging in self-care activities during break from school, such as getting her hair and nails done.  She also reported school continues to go well, and she picked up an additional course which she does not believe will imbalance schedule or responsibilities.  Veronica Deleon reported that she is getting average 8 hours sleep nightly and maintaining routine of 2-3 days working at job.  Veronica Deleon reported that she has been practicing mindful breathing and grounding technique as needed to stay calm and avoid conflict, but did have some recent experiences which tested her limits of control.  Veronica Deleon stated "The positives are definitely outweighing the negatives right now".  Clinician proposed exploring some anger management techniques with Veronica Deleon today which she could practice in following week to help manage emotions when triggered.  Clinician discussed several strategies which could be implemented in a variety of scenarios effectively, such as engaging in exercise which provides emotional outlet, taking a timeout temporarily when emotions are high, splashing cold water on face/taking a cold shower, ensuring that enough sleep is being set aside nightly to avoid fatigue related irritation, popping balloons with angry thoughts on them, reciting a self-soothing mantra, '  playing the tape forward' regarding consequences of  actions, and placing post it notes with helpful reminders such as "Breathe" around places like her car.  Veronica Deleon was receptive to these ideas, including the idea of joining a female boxing club near her home for outlet and exercise, reciting a positive mantra like "Let it go", and filling schedule with more positive, stress relieving activities to look forward to.  Clinician also offered to demonstrate 'peaceful place' guided meditation with Veronica Deleon to aid with stress relief and help her calm down when triggered.  Clinician invited Veronica Deleon to get comfortable, achieve a relaxed breathing pattern and close her eyes if comfortable, then narrated her visualization of a peaceful place for 10 minutes which had pleasant sensory details of her choosing.  Veronica Deleon was informed beforehand that if she grew uncomfortable or distressed, she could discontinue exercise and open her eyes if necessary.  Veronica Deleon participated in activity successfully and noted that she was able to imagine a cabin in the woods she has gone to in the past while it was gently raining.  She reported that she felt very relaxed afterward and believes this will help with her recurrent headaches as well.  Clinician will continue to monitor.      Plan: Follow up again in 2 weeks virtually.   Diagnosis: Major depressive disorder, recurrent, severe, with anxious distress; PTSD; and Cannabis Use Disorder, mild, in early remission.   Veronica Deleon, Elsie, LCAS 05/24/19

## 2024-03-14 ENCOUNTER — Other Ambulatory Visit: Payer: Self-pay | Admitting: Urology

## 2024-03-21 ENCOUNTER — Encounter (HOSPITAL_COMMUNITY): Payer: Self-pay | Admitting: Urology

## 2024-03-21 NOTE — Progress Notes (Signed)
 [facility] PREOP PHONE CALL   ALLERGIES REVIEWED: YES  MEDICATION REVIEW DONE: YES MEDICATIONS THAT PT SHOULD HOLD (LIST): NSAIDS hold 48hr  CAN PT WALK UP STAIRS WITHOUT SHORTNESS OF BREATH: YES HOME O2: NO CPAP: NO  IF YES, INFORMED PT TO BRING CPAP WITH TUBING AND MASK:YES/NO   INFORMED DRIVER NEEDED FOR PROCEDURE: YES   PT WAS GIVEN BLUE FOLDER AT UROLOGY APPT: YES PT INFORMED TO BRING BLUE FOLDER WITH ALL CONTENTS: YES  REVIEWED ARRIVAL TIME AND LOCATION: YES  OTHER PERTINENT INFORMATION:

## 2024-03-21 NOTE — Progress Notes (Signed)
 Attempted to obtain medical history for pre op call via telephone, unable to reach at this time. HIPAA compliant voicemail message left requesting return call to pre surgical testing department.

## 2024-03-25 ENCOUNTER — Ambulatory Visit (HOSPITAL_COMMUNITY)

## 2024-03-25 ENCOUNTER — Ambulatory Visit (HOSPITAL_COMMUNITY)
Admission: RE | Admit: 2024-03-25 | Discharge: 2024-03-25 | Disposition: A | Discharge status: Home / Self Care | Payer: Self-pay | Attending: Urology | Admitting: Urology

## 2024-03-25 ENCOUNTER — Encounter (HOSPITAL_COMMUNITY): Admission: RE | Disposition: A | Payer: Self-pay | Source: Home / Self Care | Attending: Urology

## 2024-03-25 ENCOUNTER — Encounter (HOSPITAL_COMMUNITY): Payer: Self-pay | Admitting: Urology

## 2024-03-25 DIAGNOSIS — N201 Calculus of ureter: Secondary | ICD-10-CM | POA: Insufficient documentation

## 2024-03-25 DIAGNOSIS — E669 Obesity, unspecified: Secondary | ICD-10-CM | POA: Diagnosis not present

## 2024-03-25 HISTORY — PX: EXTRACORPOREAL SHOCK WAVE LITHOTRIPSY: SHX1557

## 2024-03-25 HISTORY — DX: Personal history of urinary calculi: Z87.442

## 2024-03-25 LAB — POCT PREGNANCY, URINE: Preg Test, Ur: NEGATIVE

## 2024-03-25 MED ORDER — CIPROFLOXACIN HCL 500 MG PO TABS
500.0000 mg | ORAL_TABLET | ORAL | Status: AC
  Administered 2024-03-25: 500 mg via ORAL
  Filled 2024-03-25: qty 1

## 2024-03-25 MED ORDER — OXYCODONE HCL 5 MG PO TABS: 5.0000 mg | ORAL_TABLET | ORAL | Status: DC | PRN

## 2024-03-25 MED ORDER — KETOROLAC TROMETHAMINE 30 MG/ML IJ SOLN
30.0000 mg | Freq: Once | INTRAMUSCULAR | Status: AC
  Administered 2024-03-25: 30 mg via INTRAVENOUS
  Filled 2024-03-25: qty 1

## 2024-03-25 MED ORDER — TAMSULOSIN HCL 0.4 MG PO CAPS: 0.4000 mg | ORAL_CAPSULE | Freq: Every day | ORAL | 0 refills | Status: AC

## 2024-03-25 MED ORDER — TRAMADOL HCL 50 MG PO TABS: 50.0000 mg | ORAL_TABLET | Freq: Four times a day (QID) | ORAL | 0 refills | Status: AC | PRN

## 2024-03-25 MED ORDER — DIAZEPAM 5 MG PO TABS
10.0000 mg | ORAL_TABLET | ORAL | Status: AC
  Administered 2024-03-25: 10 mg via ORAL
  Filled 2024-03-25: qty 2

## 2024-03-25 MED ORDER — ONDANSETRON HCL 4 MG/2ML IJ SOLN
4.0000 mg | INTRAMUSCULAR | Status: DC | PRN
  Administered 2024-03-25: 4 mg via INTRAVENOUS
  Filled 2024-03-25: qty 2

## 2024-03-25 MED ORDER — DIPHENHYDRAMINE HCL 25 MG PO CAPS
25.0000 mg | ORAL_CAPSULE | ORAL | Status: AC
  Administered 2024-03-25: 25 mg via ORAL
  Filled 2024-03-25: qty 1

## 2024-03-25 MED ORDER — CELECOXIB 200 MG PO CAPS: 200.0000 mg | ORAL_CAPSULE | Freq: Two times a day (BID) | ORAL | 1 refills | Status: AC

## 2024-03-25 MED ORDER — SODIUM CHLORIDE 0.9 % IV SOLN: INTRAVENOUS | Status: DC

## 2024-03-25 NOTE — Discharge Instructions (Signed)
 1. You should strain your urine and collect all fragments and bring them to your follow up appointment.  2. You should take your pain medication as needed.  Please call if your pain is severe to the point that it is not controlled with your pain medication. 3. You should call if you develop fever > 101 or persistent nausea or vomiting. 4. Your doctor may prescribe tamsulosin  to take to help facilitate stone passage.

## 2024-03-25 NOTE — H&P (Signed)
 H&P  History of Present Illness: Veronica Deleon is a 34 y.o. year old F who presents today for treatment of a right renal stone  No acute complaints  Past Medical History:  Diagnosis Date   History of kidney stones    PCOS (polycystic ovarian syndrome)     Past Surgical History:  Procedure Laterality Date   tubes in ears      Home Medications:  Active Medications[1]  Allergies: Allergies[2]  Family History  Problem Relation Age of Onset   Healthy Mother    Healthy Father     Social History:  reports that she has quit smoking. She has never used smokeless tobacco. She reports that she does not drink alcohol and does not use drugs.  ROS: A complete review of systems was performed.  All systems are negative except for pertinent findings as noted.  Physical Exam:  Vital signs in last 24 hours: Temp:  [98.3 F (36.8 C)] 98.3 F (36.8 C) (01/16 0629) Pulse Rate:  [75] 75 (01/16 0629) Resp:  [16] 16 (01/16 0629) BP: (116)/(80) 116/80 (01/16 0629) SpO2:  [97 %] 97 % (01/16 0629) Weight:  [113.5 kg] 113.5 kg (01/16 0629) Constitutional:  Alert and oriented, No acute distress Cardiovascular: Regular rate and rhythm Respiratory: Normal respiratory effort, Lungs clear bilaterally GI: Abdomen is soft, nontender, nondistended, no abdominal masses Lymphatic: No lymphadenopathy Neurologic: Grossly intact, no focal deficits Psychiatric: Normal mood and affect   Laboratory Data:  No results for input(s): WBC, HGB, HCT, PLT in the last 72 hours.  No results for input(s): NA, K, CL, GLUCOSE, BUN, CALCIUM, CREATININE in the last 72 hours.  Invalid input(s): CO3   Results for orders placed or performed during the hospital encounter of 03/25/24 (from the past 24 hours)  Pregnancy, urine POC     Status: None   Collection Time: 03/25/24  6:20 AM  Result Value Ref Range   Preg Test, Ur NEGATIVE NEGATIVE   No results found for this or any previous  visit (from the past 240 hours).  Renal Function: No results for input(s): CREATININE in the last 168 hours. CrCl cannot be calculated (Patient's most recent lab result is older than the maximum 21 days allowed.).  Radiologic Imaging: DG Abd 1 View Result Date: 03/25/2024 EXAM: 1 VIEW XRAY OF THE ABDOMEN 03/25/2024 06:48:00 AM COMPARISON: CT urogram 02/26/2024. CLINICAL HISTORY: Right ureteral stone. FINDINGS: LINES, TUBES AND DEVICES: IUD in place. BOWEL: Nonobstructive bowel gas pattern. SOFT TISSUES: 8 mm calculus projecting over the expected area of the right renal pelvis or proximal right ureter. Pelvic phleboliths. BONES: No acute fracture. IMPRESSION: 1. 8 mm calculus projecting over the expected area of the right renal pelvis or proximal right ureter. Electronically signed by: Waddell Calk MD 03/25/2024 06:56 AM EST RP Workstation: [id number]    Assessment:  Veronica Deleon is a 34 y.o. year old F with right renal stone   Plan:  --to OR as planned for Right ESWL. Procedure and risks reviewed, including but not limited to hematuria, infection, sepsis, damage to GU tract, failure to complete procedure, retained stone fragments, need for future procedures, steinstrasse, pain, failure to pass fragments  Herlene Foot, MD 03/25/2024, 7:33 AM  [facility] Pager: [phone]      [1]  Current Meds  Medication Sig   FLUoxetine (PROZAC) 10 MG capsule Take 5 mg by mouth daily.   QUEtiapine (SEROQUEL) 25 MG tablet Take 25 mg by mouth at bedtime.  semaglutide-weight management (WEGOVY) 0.25 MG/0.5ML SOAJ SQ injection Inject 0.25 mg into the skin once a week.  [2] No Known Allergies

## 2024-03-25 NOTE — Op Note (Signed)
 See [employer] operative note scanned into chart. Also because of the size, density, location and other factors that cannot be anticipated I feel this will likely be a staged procedure. This fact supersedes any indication in the scanned [place] stone operative note to the contrary.  Herlene Foot MD 03/25/2024, 9:11 AM  [facility]  Pager: [phone]

## 2024-03-26 ENCOUNTER — Encounter (HOSPITAL_COMMUNITY): Payer: Self-pay

## 2024-03-26 ENCOUNTER — Other Ambulatory Visit: Payer: Self-pay

## 2024-03-26 ENCOUNTER — Emergency Department (HOSPITAL_COMMUNITY)
Admission: EM | Admit: 2024-03-26 | Discharge: 2024-03-26 | Disposition: A | Discharge status: Home / Self Care | Attending: Emergency Medicine | Admitting: Emergency Medicine

## 2024-03-26 DIAGNOSIS — R10A1 Flank pain, right side: Secondary | ICD-10-CM | POA: Diagnosis present

## 2024-03-26 DIAGNOSIS — R10A2 Flank pain, left side: Secondary | ICD-10-CM

## 2024-03-26 DIAGNOSIS — R112 Nausea with vomiting, unspecified: Secondary | ICD-10-CM | POA: Insufficient documentation

## 2024-03-26 LAB — COMPREHENSIVE METABOLIC PANEL WITH GFR
ALT: 8 U/L (ref 0–44)
AST: 17 U/L (ref 15–41)
Albumin: 4.2 g/dL (ref 3.5–5.0)
Alkaline Phosphatase: 78 U/L (ref 38–126)
Anion gap: 12 (ref 5–15)
BUN: 12 mg/dL (ref 6–20)
CO2: 18 mmol/L — ABNORMAL LOW (ref 22–32)
Calcium: 8.8 mg/dL — ABNORMAL LOW (ref 8.9–10.3)
Chloride: 106 mmol/L (ref 98–111)
Creatinine, Ser: 1.11 mg/dL — ABNORMAL HIGH (ref 0.44–1.00)
GFR, Estimated: >60 mL/min
Glucose, Bld: 119 mg/dL — ABNORMAL HIGH (ref 70–99)
Potassium: 4 mmol/L (ref 3.5–5.1)
Sodium: 136 mmol/L (ref 135–145)
Total Bilirubin: 0.4 mg/dL (ref 0.0–1.2)
Total Protein: 6.6 g/dL (ref 6.5–8.1)

## 2024-03-26 LAB — CBC WITH DIFFERENTIAL/PLATELET
Abs Immature Granulocytes: 0.02 K/uL (ref 0.00–0.07)
Basophils Absolute: 0 K/uL (ref 0.0–0.1)
Basophils Relative: 0 %
Eosinophils Absolute: 0 K/uL (ref 0.0–0.5)
Eosinophils Relative: 0 %
HCT: 37.4 % (ref 36.0–46.0)
Hemoglobin: 12.8 g/dL (ref 12.0–15.0)
Immature Granulocytes: 0 %
Lymphocytes Relative: 13 %
Lymphs Abs: 1.1 K/uL (ref 0.7–4.0)
MCH: 32.4 pg (ref 26.0–34.0)
MCHC: 34.2 g/dL (ref 30.0–36.0)
MCV: 94.7 fL (ref 80.0–100.0)
Monocytes Absolute: 0.5 K/uL (ref 0.1–1.0)
Monocytes Relative: 6 %
Neutro Abs: 6.4 K/uL (ref 1.7–7.7)
Neutrophils Relative %: 81 %
Platelets: 164 K/uL (ref 150–400)
RBC: 3.95 MIL/uL (ref 3.87–5.11)
RDW: 13.7 % (ref 11.5–15.5)
WBC: 8 K/uL (ref 4.0–10.5)
nRBC: 0 % (ref 0.0–0.2)

## 2024-03-26 LAB — LIPASE, BLOOD: Lipase: 34 U/L (ref 11–51)

## 2024-03-26 LAB — HCG, SERUM, QUALITATIVE: Preg, Serum: NEGATIVE

## 2024-03-26 MED ORDER — SODIUM CHLORIDE 0.9 % IV BOLUS
1000.0000 mL | Freq: Once | INTRAVENOUS | Status: AC
  Administered 2024-03-26: 1000 mL via INTRAVENOUS

## 2024-03-26 MED ORDER — ONDANSETRON HCL 4 MG/2ML IJ SOLN
4.0000 mg | Freq: Once | INTRAMUSCULAR | Status: AC
  Administered 2024-03-26: 4 mg via INTRAVENOUS
  Filled 2024-03-26: qty 2

## 2024-03-26 MED ORDER — OXYCODONE HCL 5 MG PO TABS
10.0000 mg | ORAL_TABLET | Freq: Once | ORAL | Status: AC
  Administered 2024-03-26: 10 mg via ORAL
  Filled 2024-03-26: qty 2

## 2024-03-26 MED ORDER — ONDANSETRON HCL 4 MG PO TABS: 4.0000 mg | ORAL_TABLET | Freq: Four times a day (QID) | ORAL | 0 refills | Status: AC

## 2024-03-26 MED ORDER — KETOROLAC TROMETHAMINE 30 MG/ML IJ SOLN
30.0000 mg | Freq: Once | INTRAMUSCULAR | Status: AC
  Administered 2024-03-26: 30 mg via INTRAVENOUS
  Filled 2024-03-26: qty 1

## 2024-03-26 MED ORDER — MORPHINE SULFATE (PF) 4 MG/ML IV SOLN
4.0000 mg | Freq: Once | INTRAVENOUS | Status: AC
  Administered 2024-03-26: 4 mg via INTRAVENOUS
  Filled 2024-03-26: qty 1

## 2024-03-26 MED ORDER — OXYCODONE HCL 5 MG PO TABS: 5.0000 mg | ORAL_TABLET | ORAL | 0 refills | Status: AC | PRN

## 2024-03-26 NOTE — ED Triage Notes (Signed)
 Pt had a lithotripsy 03/25/24 and is having right sharp flank pain. Pt has n/v.

## 2024-03-26 NOTE — ED Provider Notes (Signed)
 " [place] EMERGENCY DEPARTMENT AT [facility] Provider Note   CSN: [id number] Arrival date & time: 03/26/24  9948     Patient presents with: Flank Pain   Veronica Deleon is a 34 y.o. female.  Patient with known right-sided nephrolithiasis, status post lithotripsy on Friday morning presents to the emergency department complaining of right-sided flank pain.  She states she has nausea and vomiting with the pain.  The patient was prescribed tramadol  but states her insurance would not cover tramadol  and she has had no prescription pain medications since discharge.  She denies fever, chest pain, shortness of breath.    Flank Pain       Prior to Admission medications  Medication Sig Start Date End Date Taking? Authorizing Provider  ondansetron  (ZOFRAN ) 4 MG tablet Take 1 tablet (4 mg total) by mouth every 6 (six) hours. 03/26/24  Yes Logan Ubaldo NOVAK, PA-C  oxyCODONE  (ROXICODONE ) 5 MG immediate release tablet Take 1 tablet (5 mg total) by mouth every 4 (four) hours as needed for severe pain (pain score 7-10). 03/26/24  Yes Logan Ubaldo NOVAK, PA-C  celecoxib  (CELEBREX ) 200 MG capsule Take 1 capsule (200 mg total) by mouth 2 (two) times daily for 28 days. 03/25/24 04/22/24  Lovie Arlyss CROME, MD  FLUoxetine (PROZAC) 10 MG capsule Take 5 mg by mouth daily.    [provider]  levonorgestrel-ethinyl estradiol (SEASONALE,INTROVALE,JOLESSA) 0.15-0.03 MG tablet Take 1 tablet by mouth daily. 04/04/15 04/03/16  [provider]  Multiple Vitamins-Minerals (THERA-M) TABS Take 1 tablet by mouth daily.    [provider]  Probiotic Product (ACIDOPHILUS/GOAT MILK) CAPS Take 1 tablet by mouth daily.    [provider]  QUEtiapine (SEROQUEL) 25 MG tablet Take 25 mg by mouth at bedtime.    [provider]  semaglutide-weight management (WEGOVY) 0.25 MG/0.5ML SOAJ SQ injection Inject 0.25 mg into the skin once a week.    [provider]  tamsulosin   (FLOMAX ) 0.4 MG CAPS capsule Take 1 capsule (0.4 mg total) by mouth daily after supper. 03/25/24   Lovie Arlyss CROME, MD  traMADol  (ULTRAM ) 50 MG tablet Take 1 tablet (50 mg total) by mouth every 6 (six) hours as needed for up to 3 days. 03/25/24 03/28/24  Lovie Arlyss CROME, MD    Allergies: Patient has no known allergies.    Review of Systems  Genitourinary:  Positive for flank pain.    Updated Vital Signs BP 113/76   Pulse (!) 54   Temp 97.6 F (36.4 C) (Oral)   Resp 19   SpO2 94%   Physical Exam Vitals and nursing note reviewed.  Constitutional:      General: She is not in acute distress.    Appearance: She is well-developed.  HENT:     Head: Normocephalic and atraumatic.  Eyes:     Conjunctiva/sclera: Conjunctivae normal.  Cardiovascular:     Rate and Rhythm: Normal rate and regular rhythm.  Pulmonary:     Effort: Pulmonary effort is normal. No respiratory distress.     Breath sounds: Normal breath sounds.  Abdominal:     Palpations: Abdomen is soft.     Tenderness: There is no abdominal tenderness. There is right CVA tenderness.  Musculoskeletal:        General: No swelling.     Cervical back: Neck supple.  Skin:    General: Skin is warm and dry.     Capillary Refill: Capillary refill takes less than 2 seconds.  Neurological:     Mental Status: She is alert.  Psychiatric:        Mood and Affect: Mood normal.     (all labs ordered are listed, but only abnormal results are displayed) Labs Reviewed  COMPREHENSIVE METABOLIC PANEL WITH GFR - Abnormal; Notable for the following components:      Result Value   CO2 18 (*)    Glucose, Bld 119 (*)    Creatinine, Ser 1.11 (*)    Calcium 8.8 (*)    All other components within normal limits  LIPASE, BLOOD  HCG, SERUM, QUALITATIVE  CBC WITH DIFFERENTIAL/PLATELET  CBC WITH DIFFERENTIAL/PLATELET  URINALYSIS, ROUTINE W REFLEX MICROSCOPIC    EKG: None  Radiology: DG Abd 1 View Result Date: 03/25/2024 EXAM: 1 VIEW  XRAY OF THE ABDOMEN 03/25/2024 06:48:00 AM COMPARISON: CT urogram 02/26/2024. CLINICAL HISTORY: Right ureteral stone. FINDINGS: LINES, TUBES AND DEVICES: IUD in place. BOWEL: Nonobstructive bowel gas pattern. SOFT TISSUES: 8 mm calculus projecting over the expected area of the right renal pelvis or proximal right ureter. Pelvic phleboliths. BONES: No acute fracture. IMPRESSION: 1. 8 mm calculus projecting over the expected area of the right renal pelvis or proximal right ureter. Electronically signed by: Waddell Calk MD 03/25/2024 06:56 AM EST RP Workstation: [id number]     Procedures   Medications Ordered in the ED  morphine  (PF) 4 MG/ML injection 4 mg (4 mg Intravenous Given 03/26/24 0145)  ondansetron  (ZOFRAN ) injection 4 mg (4 mg Intravenous Given 03/26/24 0146)  sodium chloride  0.9 % bolus 1,000 mL (1,000 mLs Intravenous New Bag/Given 03/26/24 0146)  ketorolac  (TORADOL ) 30 MG/ML injection 30 mg (30 mg Intravenous Given 03/26/24 0258)  oxyCODONE  (Oxy IR/ROXICODONE ) immediate release tablet 10 mg (10 mg Oral Given 03/26/24 0405)                                    Medical Decision Making Amount and/or Complexity of Data Reviewed Labs: ordered.  Risk Prescription drug management.   This patient presents to the ED for concern of flank pain, this involves an extensive number of treatment options, and is a complaint that carries with it a high risk of complications and morbidity.  The differential diagnosis includes nephrolithiasis, hydronephrosis, pyelonephritis, others   Co morbidities / Chronic conditions that complicate the patient evaluation  Known nephrolithiasis   Additional history obtained:  Additional history obtained from EMR External records from outside source obtained and reviewed including urology note documenting lithotripsy and 8 mm stone   Lab Tests:  I Ordered, and personally interpreted labs.  The pertinent results include: Grossly unremarkable CMP, CBC,  lipase    Problem List / ED Course / Critical interventions / Medication management   I ordered medication including morphine , Toradol , Zofran , Roxicodone , saline bolus Reevaluation of the patient after these medicines showed that the patient improved I have reviewed the patients home medicines and have made adjustments as needed   Test / Admission - Considered:  Patient with significant improvement in symptoms after medication.  Patient given Roxicodone  prior to discharge to help with pain until she is able to go to the pharmacy in the morning.  Will prescribe different narcotic medication which will hopefully be covered by insurance.  Patient will also be prescribed Zofran .  Return precautions been provided.  Suspect pain secondary to lithotripsy and movement of stone fragments.  This is exacerbated by the lack of pain medication availability.  Final diagnoses:  Left flank pain    ED Discharge Orders          Ordered    ondansetron  (ZOFRAN ) 4 MG tablet  Every 6 hours        03/26/24 0421    oxyCODONE  (ROXICODONE ) 5 MG immediate release tablet  Every 4 hours PRN        03/26/24 0421               Logan Ubaldo NOVAK, PA-C 03/26/24 0421    Theadore Ozell HERO, MD 03/26/24 [phone]  "

## 2024-03-26 NOTE — Discharge Instructions (Addendum)
 Your symptoms this evening are likely related to your recent lithotripsy and movement of stone fragments.  I have prescribed a different pain medication which can be taken for severe/ break through pain.  You should also take ibuprofen, 600-800mg  every 6 hours.  Zofran  has been prescribed for nausea.  Please follow-up with urology for further evaluation.  Return to the emergency department if you develop worsening or life-threatening symptoms such as intractable nausea/vomiting, intractable pain, or inability to urinate.

## 2024-03-28 ENCOUNTER — Encounter (HOSPITAL_COMMUNITY): Payer: Self-pay | Admitting: Urology

## 2024-04-11 ENCOUNTER — Encounter (HOSPITAL_COMMUNITY): Payer: Self-pay | Admitting: Urology
